# Patient Record
Sex: Female | Born: 1945 | Race: White | Hispanic: No | Marital: Married | Smoking: Never smoker
Health system: Southern US, Community
[De-identification: ages and names within clinical notes are randomized; demographics above are authoritative.]

## PROBLEM LIST (undated history)

## (undated) DIAGNOSIS — I10 Essential (primary) hypertension: Secondary | ICD-10-CM

## (undated) DIAGNOSIS — G4700 Insomnia, unspecified: Secondary | ICD-10-CM

## (undated) DIAGNOSIS — G5600 Carpal tunnel syndrome, unspecified upper limb: Secondary | ICD-10-CM

## (undated) DIAGNOSIS — E539 Vitamin B deficiency, unspecified: Secondary | ICD-10-CM

## (undated) DIAGNOSIS — E785 Hyperlipidemia, unspecified: Secondary | ICD-10-CM

## (undated) DIAGNOSIS — K760 Fatty (change of) liver, not elsewhere classified: Secondary | ICD-10-CM

## (undated) DIAGNOSIS — M1990 Unspecified osteoarthritis, unspecified site: Secondary | ICD-10-CM

## (undated) DIAGNOSIS — Z789 Other specified health status: Secondary | ICD-10-CM

## (undated) DIAGNOSIS — L719 Rosacea, unspecified: Secondary | ICD-10-CM

## (undated) DIAGNOSIS — J45909 Unspecified asthma, uncomplicated: Secondary | ICD-10-CM

## (undated) DIAGNOSIS — M8580 Other specified disorders of bone density and structure, unspecified site: Secondary | ICD-10-CM

## (undated) DIAGNOSIS — K219 Gastro-esophageal reflux disease without esophagitis: Secondary | ICD-10-CM

## (undated) HISTORY — PX: CARDIOVASCULAR STRESS TEST: SHX262

## (undated) HISTORY — DX: Vitamin B deficiency, unspecified: E53.9

## (undated) HISTORY — DX: Essential (primary) hypertension: I10

## (undated) HISTORY — DX: Carpal tunnel syndrome, unspecified upper limb: G56.00

## (undated) HISTORY — DX: Insomnia, unspecified: G47.00

## (undated) HISTORY — DX: Rosacea, unspecified: L71.9

## (undated) HISTORY — DX: Hypercalcemia: E83.52

## (undated) HISTORY — DX: Unspecified osteoarthritis, unspecified site: M19.90

## (undated) HISTORY — DX: Gastro-esophageal reflux disease without esophagitis: K21.9

## (undated) HISTORY — DX: Fatty (change of) liver, not elsewhere classified: K76.0

## (undated) HISTORY — DX: Other specified disorders of bone density and structure, unspecified site: M85.80

## (undated) HISTORY — DX: Hyperlipidemia, unspecified: E78.5

## (undated) HISTORY — DX: Other specified health status: Z78.9

## (undated) HISTORY — DX: Unspecified asthma, uncomplicated: J45.909

---

## 2008-11-30 ENCOUNTER — Encounter: Payer: Self-pay | Admitting: Cardiovascular Disease

## 2008-12-30 ENCOUNTER — Encounter: Payer: Self-pay | Admitting: Cardiovascular Disease

## 2009-01-10 ENCOUNTER — Ambulatory Visit: Payer: Self-pay | Admitting: Cardiovascular Disease

## 2009-01-10 DIAGNOSIS — E785 Hyperlipidemia, unspecified: Secondary | ICD-10-CM

## 2009-01-10 DIAGNOSIS — I10 Essential (primary) hypertension: Secondary | ICD-10-CM

## 2009-01-17 ENCOUNTER — Ambulatory Visit: Payer: Self-pay | Admitting: Cardiovascular Disease

## 2009-01-17 ENCOUNTER — Encounter: Payer: Self-pay | Admitting: Cardiovascular Disease

## 2009-01-23 LAB — CONVERTED CEMR LAB
Calcium: 9.6 mg/dL (ref 8.4–10.5)
Potassium: 4.3 meq/L (ref 3.5–5.3)
Sodium: 141 meq/L (ref 135–145)

## 2009-02-14 ENCOUNTER — Telehealth: Payer: Self-pay | Admitting: Cardiovascular Disease

## 2009-03-20 ENCOUNTER — Encounter: Payer: Self-pay | Admitting: Cardiovascular Disease

## 2009-03-20 DIAGNOSIS — I6529 Occlusion and stenosis of unspecified carotid artery: Secondary | ICD-10-CM

## 2009-03-21 ENCOUNTER — Encounter: Payer: Self-pay | Admitting: Cardiovascular Disease

## 2009-03-21 ENCOUNTER — Ambulatory Visit: Payer: Self-pay

## 2009-03-24 ENCOUNTER — Ambulatory Visit: Payer: Self-pay | Admitting: Cardiovascular Disease

## 2009-03-27 ENCOUNTER — Telehealth: Payer: Self-pay | Admitting: Cardiovascular Disease

## 2009-04-06 ENCOUNTER — Ambulatory Visit: Payer: Self-pay | Admitting: Internal Medicine

## 2010-04-02 ENCOUNTER — Encounter: Payer: Self-pay | Admitting: Surgery

## 2010-04-03 NOTE — Assessment & Plan Note (Signed)
Summary: rov   Visit Type:  Follow-up Referring Provider:  Gilda Crease, MD Primary Provider:  Nicky Pugh  CC:  sob due to asthma, no cp, and no swelling .  History of Present Illness: 65 yo WF with history  of uncontrolled HTN as well as hyperlipidemia here today for assessment of blood pressure. She has allergies to ARBs, Norvasc, HCTZ (not documented allergies but vague complaints of head burning, cramps). She has taken a beta blocker in the past but had bradycardia and hypotension and per the patient, chest pains were associated with her beta blocker.  She has been on clonidine which caused her to feel fatigued and caused cramps. Records indicate that Cozaar caused her head to burn, leg cramps and back pains. HCTZ caused cramps per patient. Norvasc caused visual blurring.  .   At the last visit, I started her on Lisinopril. She has tolerated this well. She tells me that she has had issues with her asthma lately because her new neighbors smoke tobacco and marijuana. Her blood pressures have been well controlled at home. She has no complaints today and denies chest pain, SOB or palpitations, dizziness, near syncope or syncope.   Renal artery dopplers showed no evidence of renal artery stenosis.  Echocardiogram showed normal LV function with  mild LVH, no significant valvular abnormalities.   Current Medications (verified): 1)  Crestor 5 Mg Tabs (Rosuvastatin Calcium) .... Take One Tablet By Mouth Daily. 2)  Zantac 150 Mg Tabs (Ranitidine Hcl) .Marland Kitchen.. 1 By Mouth Once Daily As Needed 3)  Zyrtec Hives Relief 10 Mg Tabs (Cetirizine Hcl) .... 1/2 By Mouth Once Daily 4)  Xopenex Hfa 45 Mcg/act Aero (Levalbuterol Tartrate) .... 2 Puffs Q Every 6 Hours As Needed 5)  Vitamin B-6 100 Mg Tabs (Pyridoxine Hcl) .Marland Kitchen.. 1 By Mouth Once Daily - On Hold 6)  Calcium Citrate Plus  Tabs (Multiple Minerals-Vitamins) .Marland Kitchen.. 1 By Mouth Once Daily -On Hold 7)  Lisinopril 10 Mg Tabs (Lisinopril) .... Take One Tablet By  Mouth Daily 8)  Vitamin E 200 Unit Caps (Vitamin E) .Marland Kitchen.. 1 By Mouth Once Daily 9)  Pulmicort Flexhaler 90 Mcg/act Aepb (Budesonide) .... 2 Puffs Every Am 10)  Zithromax 1 Gm Pack (Azithromycin) .... 5 Day Dose As Directed 11)  Atrovent Hfa 17 Mcg/act Aers (Ipratropium Bromide Hfa) .... 2 Puffs Every 6 Hours As Needed 12)  Xopenex Hfa 45 Mcg/act Aero (Levalbuterol Tartrate) .... As Directed  Allergies (verified): 1)  ! * Ivp Dye 2)  ! Sulfa 3)  ! Amoxicillin 4)  ! Keflex 5)  ! Asa 6)  ! Prednisone 7)  ! * Latex  Past History:  Past Medical History: Reviewed history from 01/10/2009 and no changes required. Hypertension Osteopenia Mixed hyperlipidemia vitamin d deficiency GERD Hypercalcemia Insomnia Herpes Zoster Vitamin B deficiency allergy  rosacea secondary hyperparathyroidism carpel tunnel syndrome Stress test 4 years ago at [facility], normal per pt  Social History: Reviewed history from 01/10/2009 and no changes required. MULTPLE FOOD ALLERGY NO TOBACCO NO DRUG USE DECAF ONLY Retired from state, works at [employer], 3 children, 7 grandchildren  Review of Systems       The patient complains of shortness of breath.  The patient denies fatigue, malaise, fever, weight gain/loss, vision loss, decreased hearing, hoarseness, chest pain, palpitations, prolonged cough, wheezing, sleep apnea, coughing up blood, abdominal pain, blood in stool, nausea, vomiting, diarrhea, heartburn, incontinence, blood in urine, muscle weakness, joint pain, leg swelling, rash, skin lesions, headache,  fainting, dizziness, depression, anxiety, enlarged lymph nodes, easy bruising or bleeding, and environmental allergies.    Vital Signs:  Patient profile:   65 year old female Height:      59 inches Weight:      140.75 pounds BMI:     28.53 Pulse rate:   72 / minute Pulse rhythm:   regular BP sitting:   148 / 68  (left arm) Cuff size:   regular  Vitals Entered By: Charlena Cross, RN, BSN (March 24, 2009 11:27 AM)  Physical Exam  General:  General: Well developed, well nourished, NAD Psychiatric: Mood and affect normal Neck: No JVD, no carotid bruits, no thyromegaly, no lymphadenopathy. Lungs:Clear bilaterally, no wheezes, rhonci, crackles CV: RRR no murmurs, gallops rubs Abdomen: soft, NT, ND, BS present Extremities: No edema, pulses 2+.    Echocardiogram  Procedure date:  03/21/2009  Findings:      Study Conclusions            - Left ventricle: The cavity size was normal. There was mild focal       basal and mild concentric hypertrophy of the septum. Systolic       function was normal. The estimated ejection fraction was in the       range of 55% to 60%. Wall motion was normal; there were no       regional wall motion abnormalities.     - Mitral valve: Calcified annulus.  Arterial Doppler  Procedure date:  03/21/2009  Findings:      Bilateral renal arteries patent without any stenosis. Kidney normal size.  Impression & Recommendations:  Problem # 1:  ESSENTIAL HYPERTENSION, BENIGN (ICD-401.1) Blood pressure is better controlled on Lisinopril, however, it is still not at goal. I have asked her to increase the Lisinopril to 20 mg per day but she does not want to do this until her asthma clears up. She will discuss this further with Dr. Candelaria Stagers. No reversible causes of HTN isolated. Please see HPI for all of the problems she has had with other classes of anti-hypertensive medications. We will see her back as needed.   Her updated medication list for this problem includes:    Lisinopril 10 Mg Tabs (Lisinopril) .Marland Kitchen... Take one tablet by mouth daily  Patient Instructions: 1)  Your physician recommends that you schedule a follow-up appointment as needed. 2)  Your physician recommends that you continue on your current medications as directed. Please refer to the Current Medication list given to you today.

## 2010-04-03 NOTE — Miscellaneous (Signed)
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 

## 2010-04-03 NOTE — Miscellaneous (Signed)
Summary: Orders Update  Clinical Lists Changes  Orders: Added new Test order of Renal Artery Duplex (Renal Artery Duplex) - Signed 

## 2010-04-03 NOTE — Progress Notes (Signed)
Summary: BP readings  Phone Note Call from Patient   Summary of Call: BP readings: taken over 13 days. Average BP 116/68.  Highest BP 138/72.     Initial call taken by: Charlena Cross, RN, BSN,  March 27, 2009 10:09 AM     Appended Document: BP readings REviewed. thanks. cdm

## 2010-04-04 ENCOUNTER — Encounter: Payer: Self-pay | Admitting: Surgery

## 2010-05-03 ENCOUNTER — Encounter: Payer: Self-pay | Admitting: Surgery

## 2010-06-03 ENCOUNTER — Encounter: Payer: Self-pay | Admitting: Surgery

## 2010-07-03 ENCOUNTER — Encounter: Payer: Self-pay | Admitting: Surgery

## 2010-09-25 ENCOUNTER — Encounter: Payer: Self-pay | Admitting: Cardiovascular Disease

## 2010-11-11 IMAGING — CT CT CHEST W/O CM
1 series · 15 of 32 positions shown, 19 images · non-contrast
Comparison: none

REASON FOR EXAM: PT ALLERGIC TO  DYE abn CXR right lung  pulmonary nodule
COMMENTS:

[Series 2: soft tissue · axial · 0.64mm/px · z∈[-222,+38]mm · 15 of 58 slices shown, 19 images]
[im 4/58  soft-tissue]
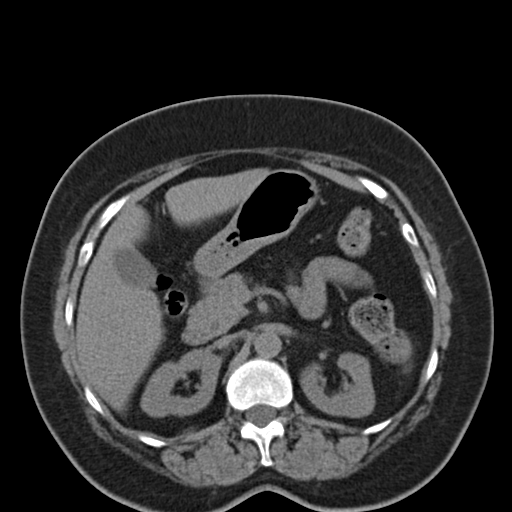
[im 4/58  bone]
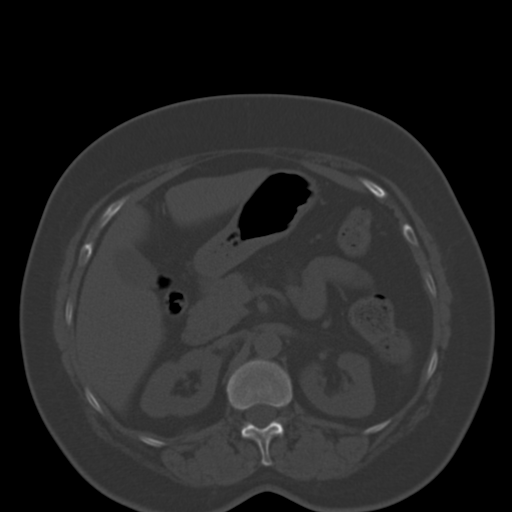
[im 8/58  soft-tissue]
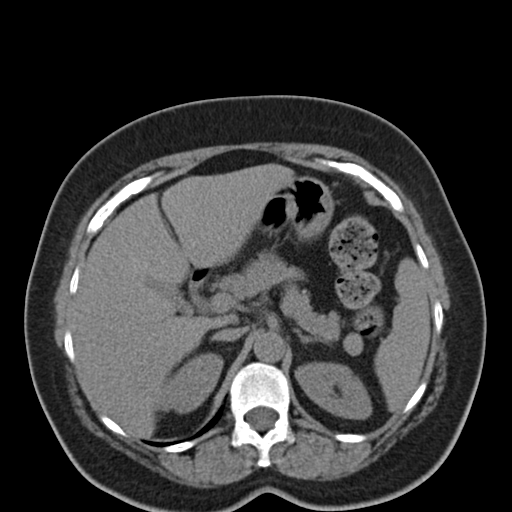
[im 12/58  soft-tissue]
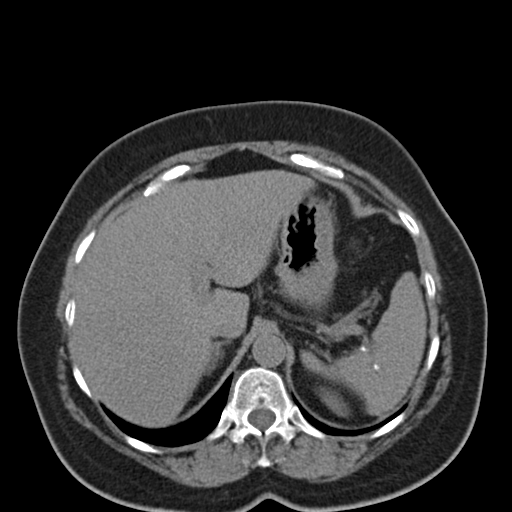
[im 17/58  soft-tissue]
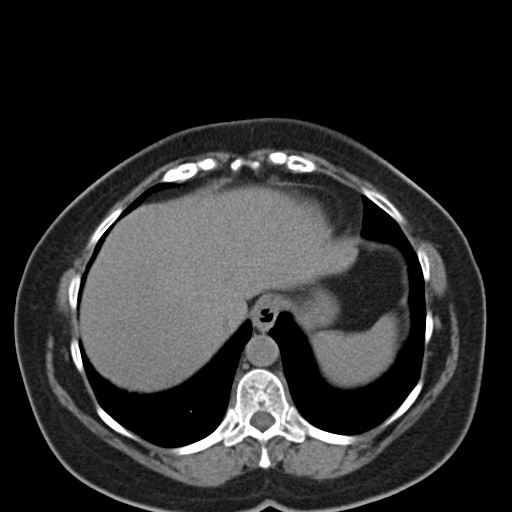
[im 21/58  soft-tissue]
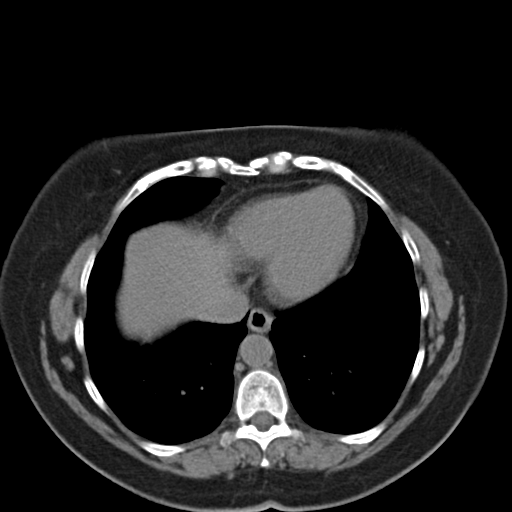
[im 24/58  soft-tissue]
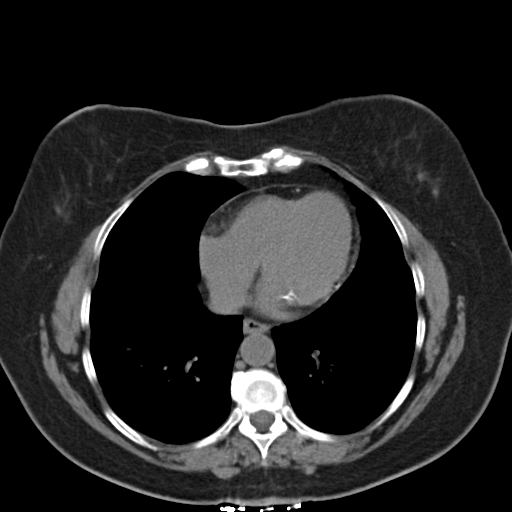
[im 30/58  soft-tissue]
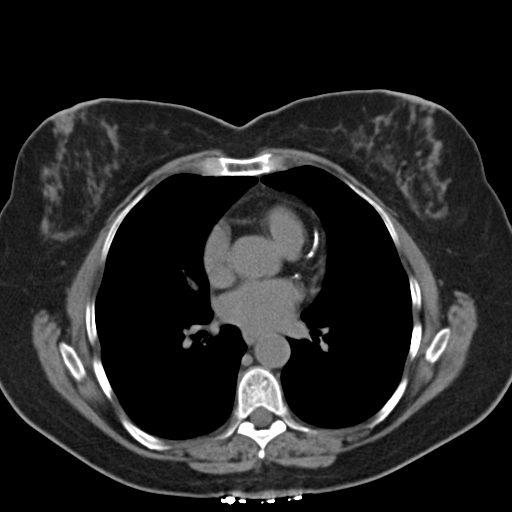
[im 34/58  soft-tissue]
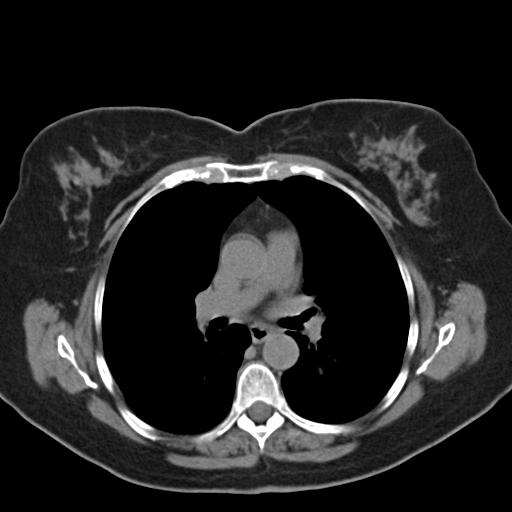
[im 37/58  soft-tissue]
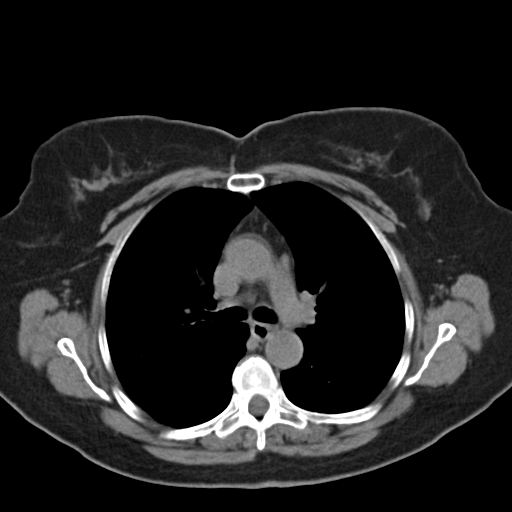
[im 37/58  bone]
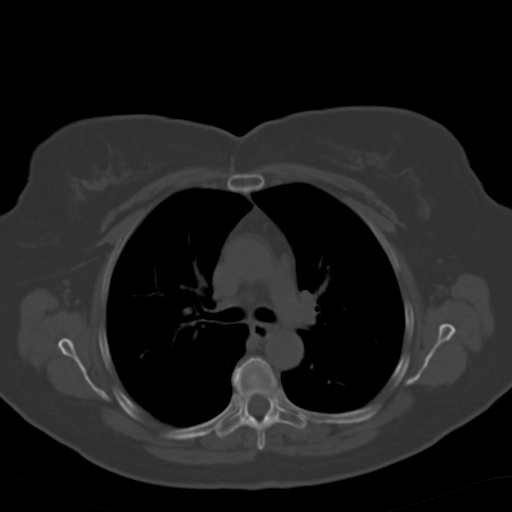
[im 41/58  soft-tissue]
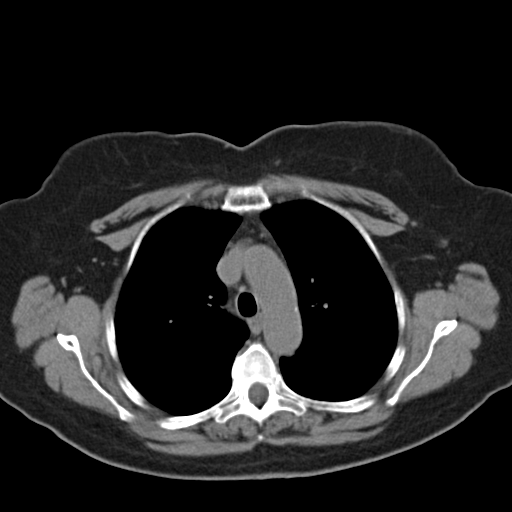
[im 46/58  soft-tissue]
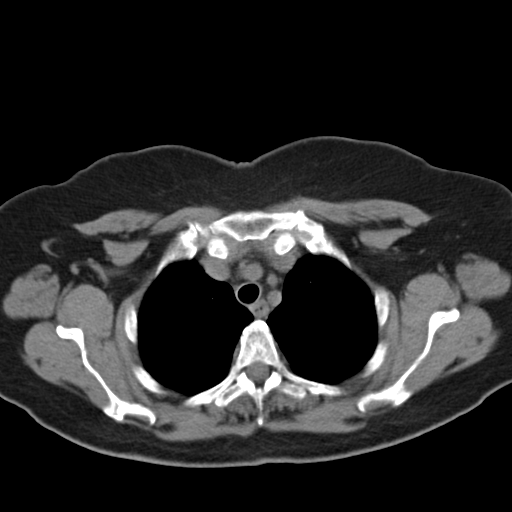
[im 50/58  soft-tissue]
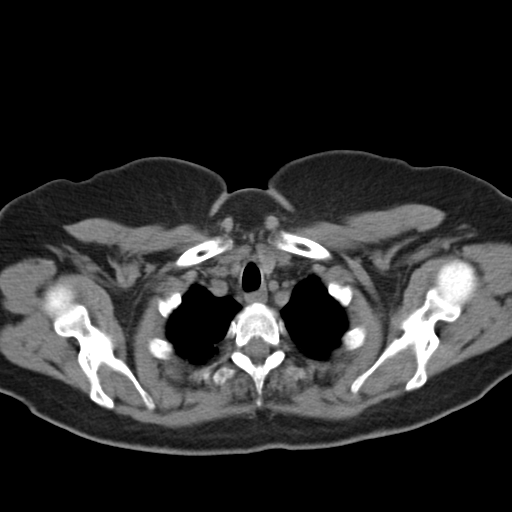
[im 50/58  lung]
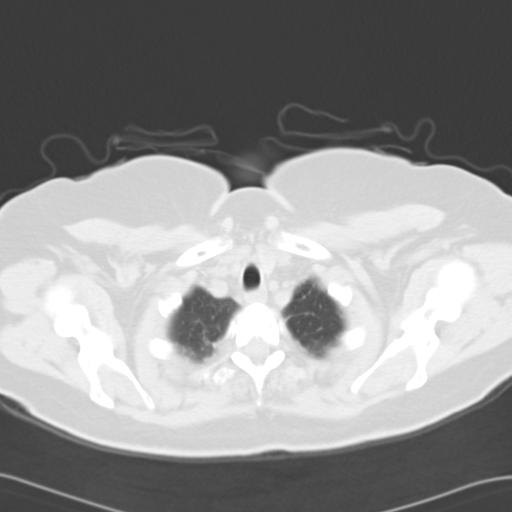
[im 52/58  lung]
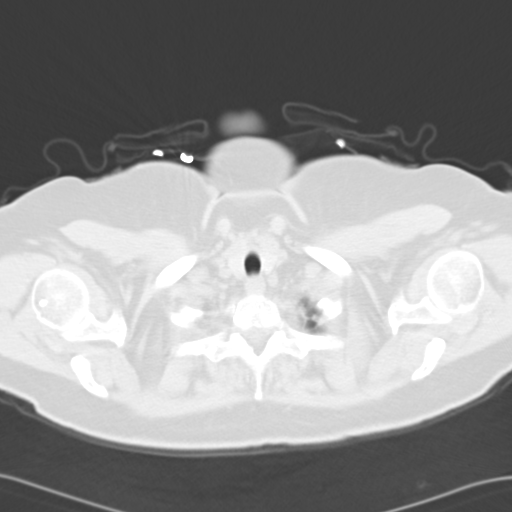
[im 54/58  soft-tissue]
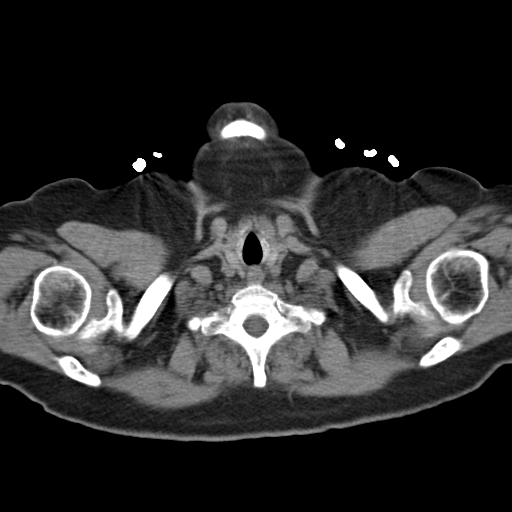
[im 54/58  lung]
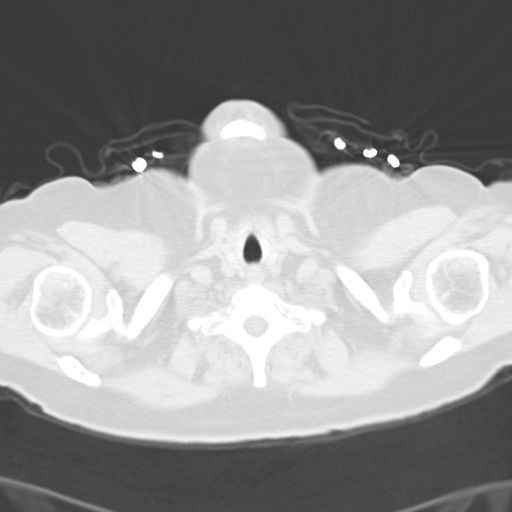
[im 56/58  lung]
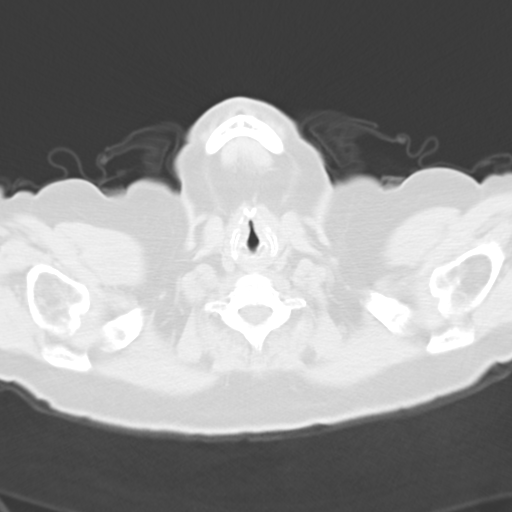

[15 of 32 positions shown; findings below may reference images not displayed]

PROCEDURE:     CT  - CT CHEST WITHOUT CONTRAST  - April 06, 2009  [DATE]

RESULT:     Axial noncontrast CT scanning was performed through the chest at
5 mm intervals and slice thicknesses. Due to significant allergy history the
patient requested to not receive IV contrast despite undergoing a
premedication protocol. Review of 3-dimensional reconstructed images was
performed separately on the [employer] monitor.

At lung window settings I see no interstitial nor alveolar infiltrates. No
pulmonary nodules are identified. At mediastinal window settings the cardiac
chambers are normal in size. There is coronary artery calcification. The
caliber of the thoracic aorta is normal. There is a small hiatal hernia. I
see no bulky mediastinal or hilar or axillary lymph nodes. There is no
pleural nor pericardial effusion. Within the upper abdomen the observed
portions of the liver are normal. There is an accessory spleen measuring
cm in diameter. Punctate calcifications within the spleen are consistent
with prior granulomatous infection. I see no adrenal masses. The thoracic
vertebral bodies are preserved in height. The retrosternal soft tissues
appear normal.
IMPRESSION: 1. I do not see evidence of pulmonary parenchymal nodules. No interstitial
nor alveolar infiltrates are demonstrated.
2. The cardiac chambers are normal in size. There are coronary artery
calcifications present. The caliber of the thoracic aorta is normal.
3. There is no pleural nor pericardial effusion.

## 2011-08-13 ENCOUNTER — Ambulatory Visit: Payer: Self-pay | Admitting: Family Medicine

## 2012-12-18 ENCOUNTER — Ambulatory Visit: Payer: Self-pay | Admitting: Family Medicine

## 2013-03-19 IMAGING — US US PELV - US TRANSVAGINAL
1 series · 14 of 25 positions shown · non-contrast
Comparison: none

REASON FOR EXAM: vaginal pain
COMMENTS:

PROCEDURE:     US  - US PELVIS EXAM W/TRANSVAGINAL  - August 13, 2011 [DATE]
RESULT:     Comparison: None
TECHNIQUE: Multiple transabdominal gray-scale images and endovaginal
gray-scale images of the pelvis performed.

[Series 1: us pelv - us transvaginal · 0.31mm/px · 14 of 54 slices shown]
[im 1/54]
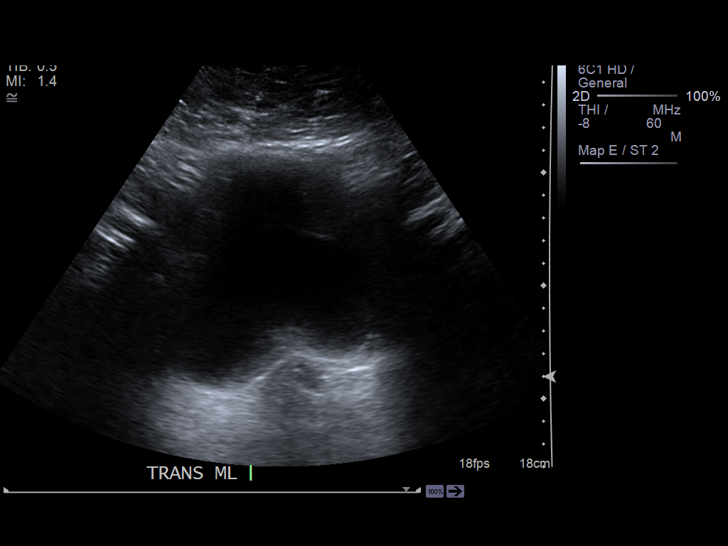
[im 5/54]
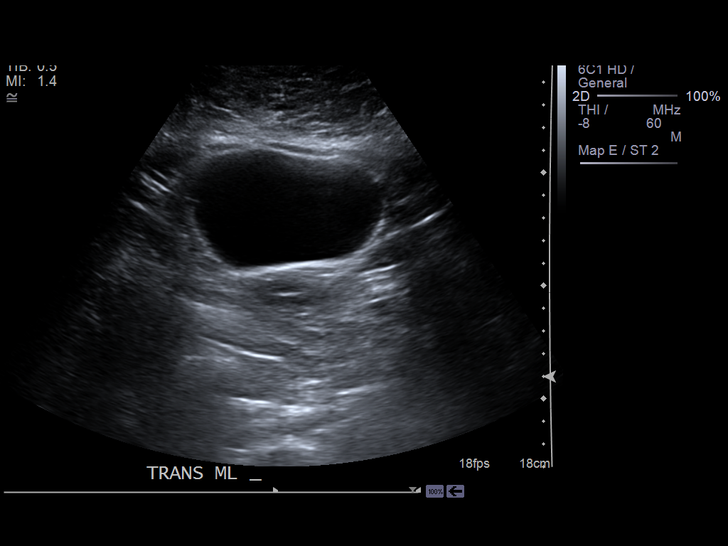
[im 9/54]
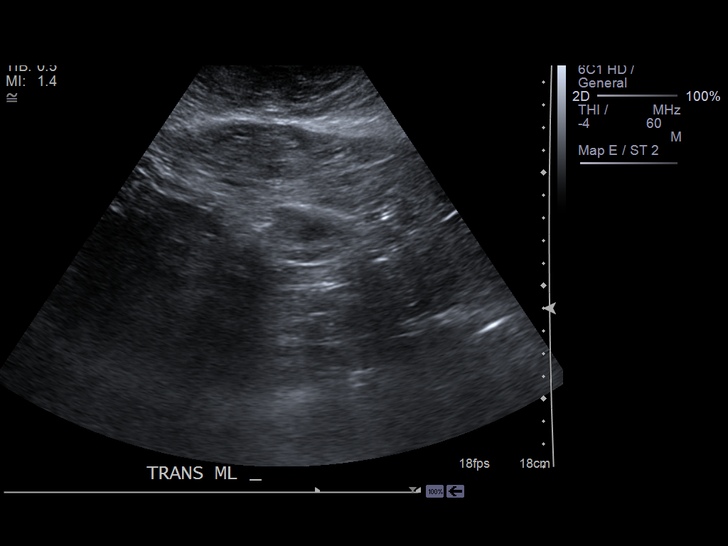
[im 14/54]
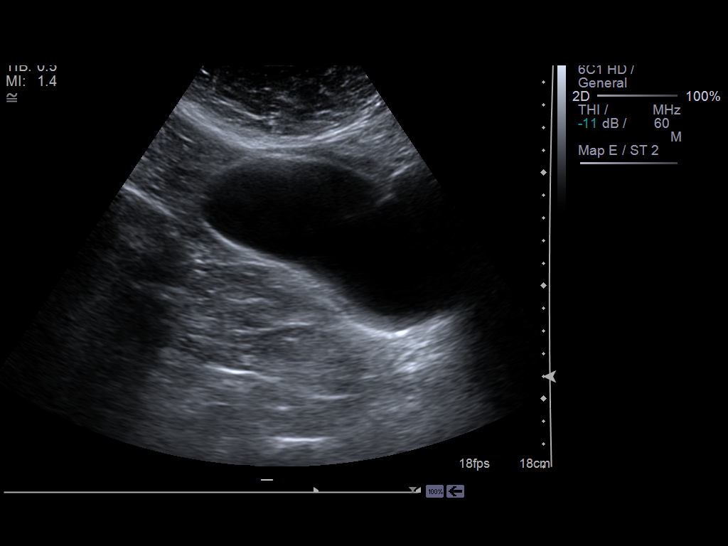
[im 18/54]
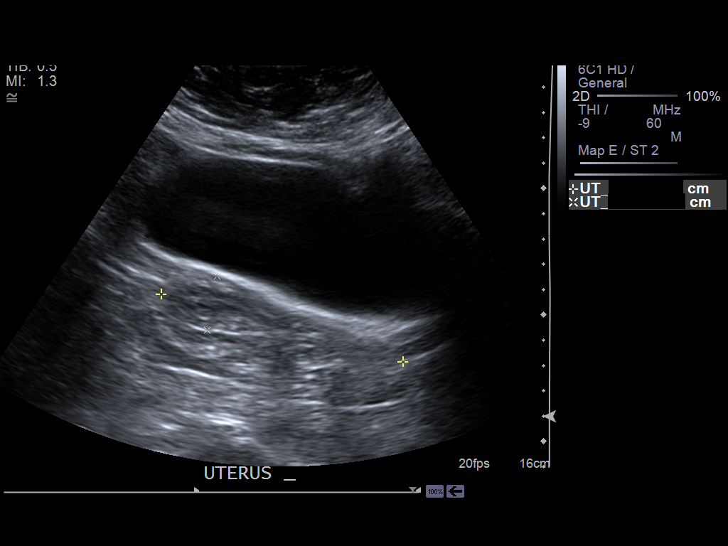
[im 20/54]
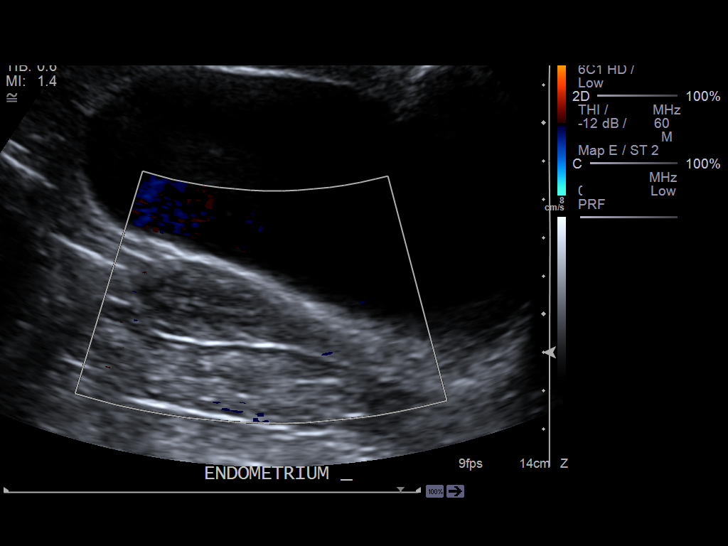
[im 25/54]
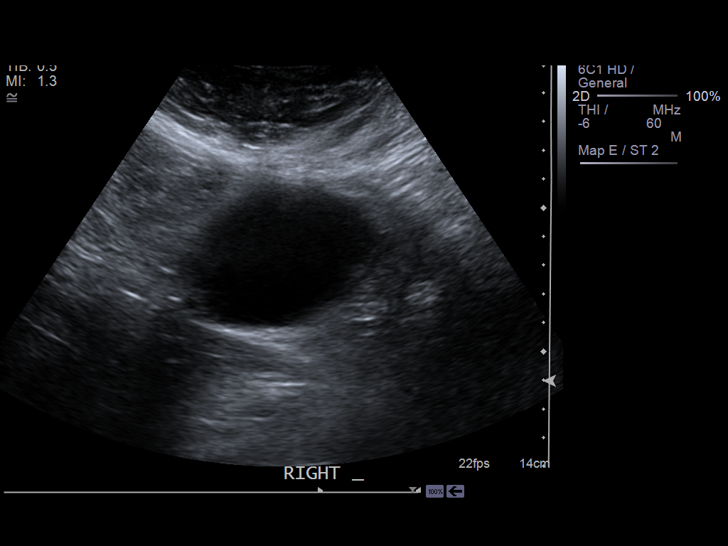
[im 29/54]
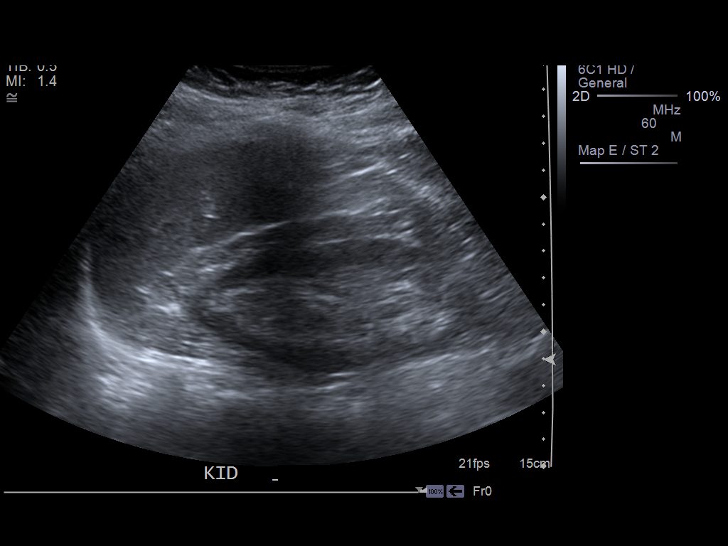
[im 34/54]
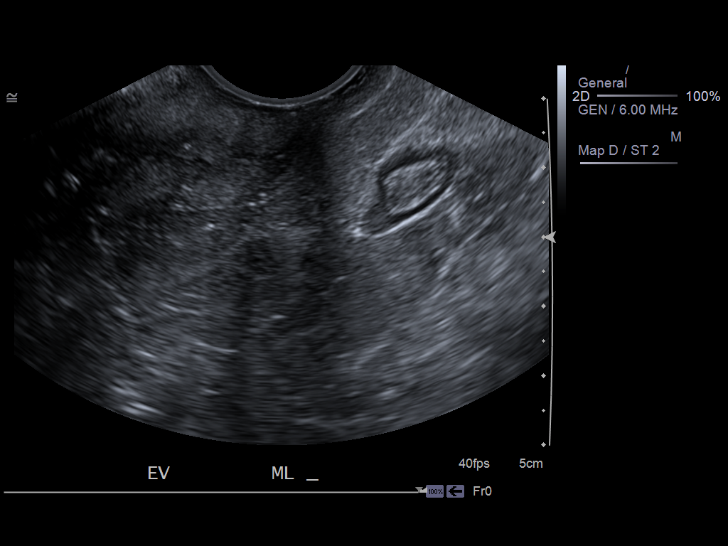
[im 36/54]
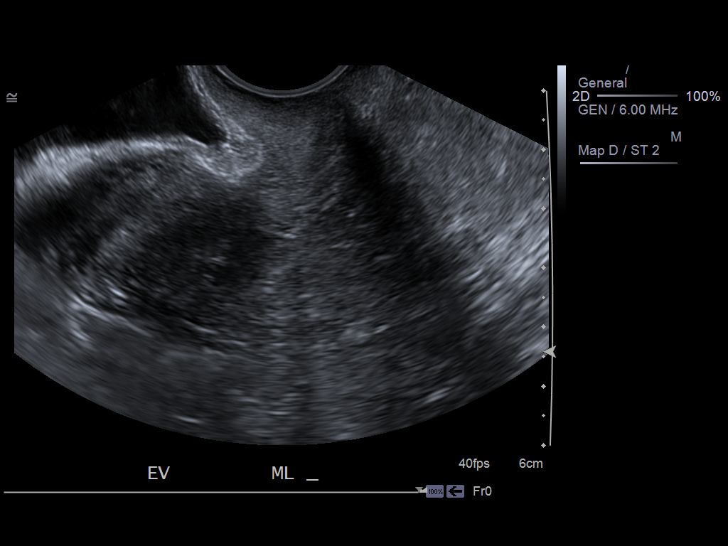
[im 40/54]
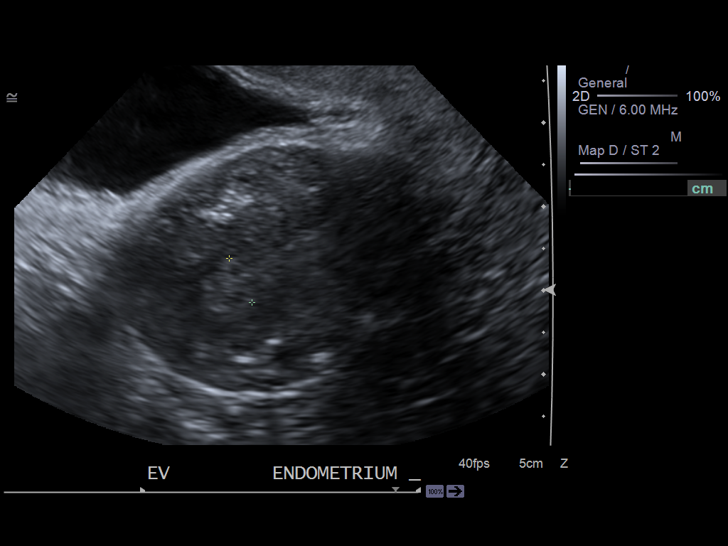
[im 45/54]
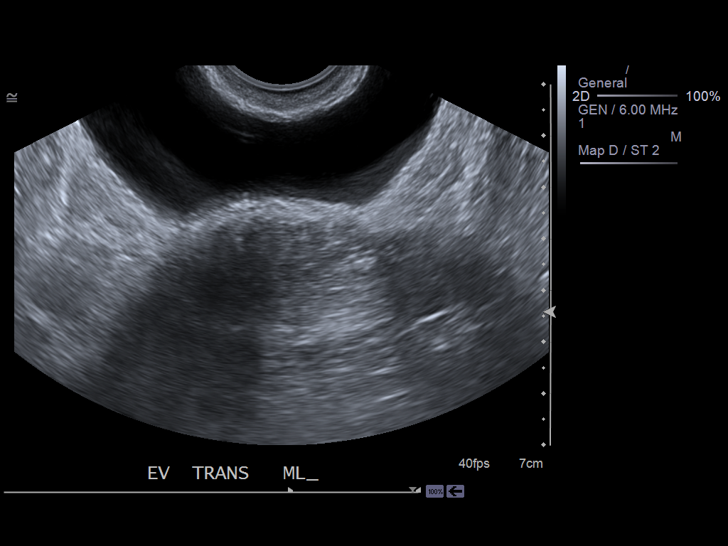
[im 49/54]
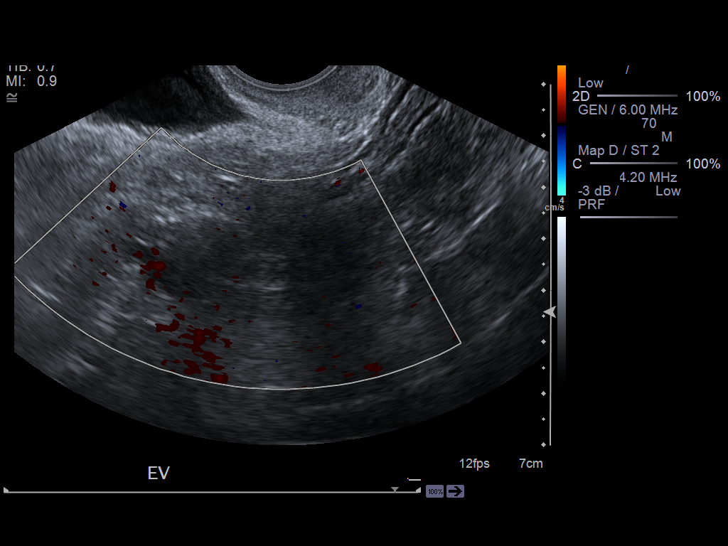
[im 54/54]
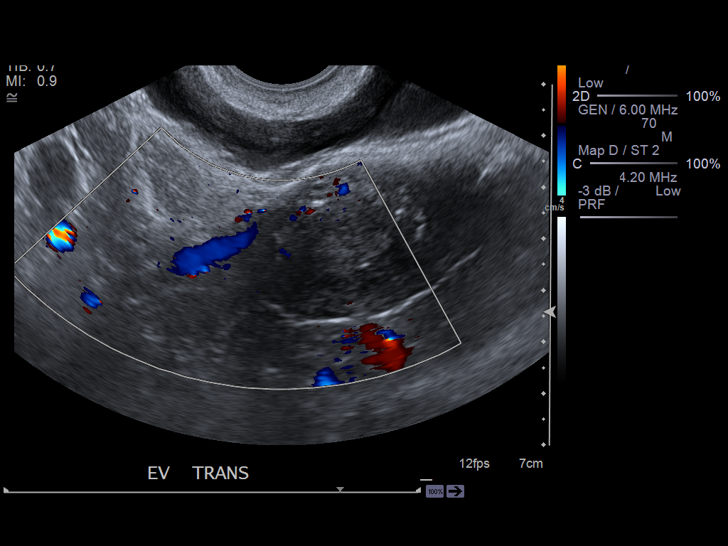

[14 of 25 positions shown; findings below may reference images not displayed]

FINDINGS: The uterus is normal in echotexture measuring 9.9 x 4.4 x 2.3 cm , with
transabdominal ultrasound. The endometrial stripe is uniform and homogeneous
measuring 6 mm.  There are no abnormal solid or cystic myometrial mass
lesions noted.

Neither ovary is visualized.

There is no pelvic free fluid.
IMPRESSION: Neither ovary is visualized. Otherwise unremarkable pelvic ultrasound.

## 2013-08-23 ENCOUNTER — Ambulatory Visit: Payer: Self-pay | Admitting: Family Medicine

## 2013-09-01 ENCOUNTER — Ambulatory Visit: Payer: Self-pay | Admitting: Family Medicine

## 2013-10-02 ENCOUNTER — Ambulatory Visit: Payer: Self-pay | Admitting: Family Medicine

## 2013-11-02 ENCOUNTER — Ambulatory Visit: Payer: Self-pay | Admitting: Family Medicine

## 2014-08-31 ENCOUNTER — Encounter: Payer: Medicare Other | Attending: Nuclear Medicine | Admitting: *Deleted

## 2014-08-31 ENCOUNTER — Encounter: Payer: Self-pay | Admitting: *Deleted

## 2014-08-31 VITALS — BP 122/76 | Ht 59.5 in | Wt 147.3 lb

## 2014-08-31 DIAGNOSIS — E119 Type 2 diabetes mellitus without complications: Secondary | ICD-10-CM | POA: Diagnosis present

## 2014-08-31 NOTE — Progress Notes (Signed)
Diabetes Self-Management Education  Visit Type: First/Initial  Appt. Start Time: 1035 Appt. End Time: 1215  08/31/2014  [name], identified by name and date of birth, is a 69 y.o. female with a diagnosis of Diabetes: Type 2.    ASSESSMENT Blood pressure 122/76, height 4' 11.5" (1.511 m), weight 147 lb 4.8 oz (66.815 kg). Body mass index is 29.26 kg/(m^2).  Initial Visit Information: Are you currently following a meal plan?: Yes What type of meal plan do you follow?: gluten free Are you taking your medications as prescribed?: Yes Are you checking your feet?: No How often do you need to have someone help you when you read instructions, pamphlets, or other written materials from your doctor or pharmacy?: 1 - Never What is the last grade level you completed in school?: 12th   Psychosocial: Patient Belief/Attitude about Diabetes: Motivated to manage diabetes (also reports "stressful") Self-care barriers: None Self-management support: Family, Doctor's office Patient Concerns: Nutrition/Meal planning, Monitoring, Glycemic Control, Weight Control, Healthy Lifestyle Special Needs: None Preferred Learning Style: Auditory, Visual, Hands on Learning Readiness: Change in progress  Complications:  Last HgB A1C per patient/outside source: 6.3 % (08/12/14) How often do you check your blood sugar?: 1-2 times/day Fasting Blood glucose range (mg/dL): [phone] Postprandial Blood glucose range (mg/dL): [phone] Have you had a dilated eye exam in the past 12 months?: No Have you had a dental exam in the past 12 months?: Yes  Diet Intake: Breakfast: oatmeal with nuts, 1/2 apple - has to have gluten free (has multiple food allergies) Lunch: chicken, broccoli Snack (afternoon): fruit Dinner: same as lunch Snack (evening): fruit  Exercise: Exercise: Light (walking) Light Exercise amount of time (min / week): 150  Individualized Plan for Diabetes Self-Management Training:  Learning  Objective:  Patient will have a greater understanding of diabetes self-management.  Education Topics Reviewed with Patient Today: Definition of diabetes, type 1 and 2, and the diagnosis of diabetes Food label reading, portion sizes and measuring food., Carbohydrate counting Role of exercise on diabetes management, blood pressure control and cardiac health. Purpose and frequency of SMBG., Interpreting lab values - A1C, lipid, urine microalbumina., Identified appropriate SMBG and/or A1C goals. Relationship between chronic complications and blood glucose control Role of stress on diabetes  PATIENTS GOALS/Plan (Developed by the patient): Improve blood sugars Prevent diabetes complications Lose weight Lead a healthier lifestyle Become more fit  Plan:   Patient Instructions  Check blood sugars 2 x day before breakfast and 2 hrs after supper (3-4 x week) Exercise: Continue walking for  40  minutes  6-7 days a week Eat 3 meals day,  1-2  snacks a day Space meals 4-6 hours apart Complete 3 Day Food Record and bring to next appt Bring blood sugar records to the next appointment  Expected Outcomes:  Demonstrated interest in learning. Expect positive outcomes  Education material provided:  General Meal Planning Guidelines 3 Day Food Record  If problems or questions, patient to contact team via:   Kathryn SettlerSheila Arul Farabee, RN, CCM, CDE [phone]  Future DSME appointment:   September 09, 2014 with dietitian

## 2014-08-31 NOTE — Patient Instructions (Signed)
Check blood sugars 2 x day before breakfast and 2 hrs after supper (3-4 x week)  Exercise: Continue walking for  40  minutes  6-7 days a week  Eat 3 meals day,  1-2  snacks a day Space meals 4-6 hours apart Complete 3 Day Food Record and bring to next appt  Bring blood sugar records to the next appointment  Return for appointment on:  September 09, 2014 [facility]

## 2014-09-09 ENCOUNTER — Encounter: Payer: Medicare Other | Attending: Nuclear Medicine | Admitting: Dietician

## 2014-09-09 VITALS — BP 124/80 | Ht 59.5 in | Wt 146.6 lb

## 2014-09-09 DIAGNOSIS — E119 Type 2 diabetes mellitus without complications: Secondary | ICD-10-CM | POA: Diagnosis present

## 2014-09-09 NOTE — Progress Notes (Signed)
   Instructed patient on meal planning for gluten-free diet (pt following GF due to wheat allergy) and weight loss to improve fatty liver.  She reports some reaction to gluten-containing foods other than wheat.   Provided guidance for 1200kcal meal plan and discussed menu options.  Reviewed food choices to comply with patient's other food allergies.  Discussed exercise and encouraged pt to add some strength training exercise; she is also interested in starting some balance exercises.  Reviewed foot care, stress management, and HbA1C -- estimated average glucose per patient request.

## 2014-09-09 NOTE — Patient Instructions (Signed)
   Try eatingwell.com website and look for 7-day gluten free meal plan for 1200 calories.   Plan to eat a small meal or snack every 4-5 hours throughout the day.   Include a portion of some protein along with small portions of a starch and a fruit with each meal.   Make the low-carb vegetables the main part of your meal to save calories.  Try some strength-building and balance exercises in addition to walking.

## 2014-11-18 ENCOUNTER — Ambulatory Visit
Admission: EM | Admit: 2014-11-18 | Discharge: 2014-11-18 | Disposition: A | Discharge status: Home / Self Care | Payer: Medicare Other | Attending: Family Medicine | Admitting: Family Medicine

## 2014-11-18 DIAGNOSIS — J029 Acute pharyngitis, unspecified: Secondary | ICD-10-CM | POA: Diagnosis not present

## 2014-11-18 LAB — RAPID STREP SCREEN (MED CTR MEBANE ONLY): Streptococcus, Group A Screen (Direct): NEGATIVE

## 2014-11-18 NOTE — ED Provider Notes (Signed)
CSN: [id number]     Arrival date & time 11/18/14  1901 History   First MD Initiated Contact with Patient 11/18/14 2004     Chief Complaint  Patient presents with  . Sore Throat   patient reports that she's had a sore throat started today. She states the last time she had a sore throat like this is suddenly happened she didn't seek medical care for several days and she want up having a strep throat. She states this sore throat is similar to that episode of pharyngitis. She tried to see a doctor was unable to get in after 4 she came here to be seen and evaluated. She really denies any other significant problems nasal congestion she does have a white spot on her right tonsil she is concerned about the most pain is on the left side or pharynx. (Consider location/radiation/quality/duration/timing/severity/associated sxs/prior Treatment) Patient is a 69 y.o. female presenting with pharyngitis. The history is provided by the patient. No language interpreter was used.  Sore Throat This is a new problem. The current episode started 3 to 5 hours ago. The problem occurs constantly. The problem has been gradually worsening. Pertinent negatives include no chest pain, no abdominal pain and no headaches. Nothing aggravates the symptoms. Nothing relieves the symptoms. Treatments tried: Gargling with Lioisterine.    Past Medical History  Diagnosis Date  . HTN (hypertension)   . Osteopenia   . HLD (hyperlipidemia)     mixed  . Vitamin D deficiency   . GERD (gastroesophageal reflux disease)   . Hypercalcemia   . Insomnia   . Herpes zoster   . Vitamin B deficiency   . Allergy history unknown   . Rosacea   . Hyperparathyroidism     secondary  . Carpal tunnel syndrome   . Arthritis   . Asthma   . Fatty liver    Past Surgical History  Procedure Laterality Date  . Cesarean section      x3  . Cardiovascular stress test      nml    Family History  Problem Relation Age of Onset  . Breast cancer     family hx  . Colon cancer      family hx  . Hypertension Brother     CABG   . Diabetes Mother    Social History  Substance Use Topics  . Smoking status: Never Smoker   . Smokeless tobacco: Never Used     Comment: no tobacco   . Alcohol Use: No   OB History    No data available     Review of Systems  HENT: Positive for sore throat. Negative for congestion, dental problem, ear discharge, ear pain and sinus pressure.   Respiratory: Negative for apnea.   Cardiovascular: Negative for chest pain.  Gastrointestinal: Negative for abdominal pain.  Neurological: Negative for headaches.  All other systems reviewed and are negative.   Allergies  Benzonatate; Clarithromycin; Clindamycin; Hydrochlorothiazide; Moxifloxacin; Penicillins; Quinolones; Sulfa antibiotics; Wheat bran; Albuterol; Amoxicillin; Aspirin; Azithromycin; Banana; Cephalexin; Cetirizine; Chocolate; Eggs or egg-derived products; Fexofenadine; Fish allergy; Ketorolac; Latex; Levofloxacin; Milk-related compounds; Other; Prednisone; Pumpkin seed; Rosuvastatin; Sulfonamide derivatives; Tomato; Baking soda-fluoride ; and Telithromycin  Home Medications   Prior to Admission medications   Medication Sig Start Date End Date Taking? Authorizing Lennyn Bellanca  losartan (COZAAR) 25 MG tablet Take 25 mg by mouth 2 (two) times daily.    Yes Historical Charleston Vierling, MD  ONE TOUCH ULTRA TEST test strip USE UTD TO TEST BLOOD  SUGAR TID 08/31/14  Yes Historical Afnan Emberton, MD  Letta Pate DELICA LANCETS 33G MISC TEST BLOOD SUGAR TID UTD 08/03/14  Yes Historical Shavonta Gossen, MD  ranitidine (ZANTAC) 150 MG tablet Take 150 mg by mouth daily as needed.     Yes Historical Kura Bethards, MD  Vitamin D, Ergocalciferol, (DRISDOL) 50000 UNITS CAPS capsule Take 50,000 Units by mouth every 7 (seven) days.    Yes Historical Delon Revelo, MD  Budesonide (PULMICORT FLEXHALER) 90 MCG/ACT inhaler Inhale 2 puffs into the lungs daily as needed.     Historical Vibhav Waddill, MD  EPINEPHrine  (EPIPEN 2-PAK) 0.3 mg/0.3 mL IJ SOAJ injection Inject 0.3 mg into the muscle once as needed.     Historical Carmelia Tiner, MD  fluocinonide (LIDEX) 0.05 % external solution APPLY TO AFFECTED AREAS ON SCALP ONCE DAILY PRF TENDERNESS 08/24/14   Historical Harjas Biggins, MD  ipratropium (ATROVENT HFA) 17 MCG/ACT inhaler Inhale 2 puffs into the lungs every 6 (six) hours as needed.     Historical Yanin Muhlestein, MD  levalbuterol Pauline Aus) 0.63 MG/3ML nebulizer solution Inhale 0.63 mg into the lungs every 6 (six) hours as needed.  05/02/14 05/02/15  Historical Chayla Shands, MD   Meds Ordered and Administered this Visit  Medications - No data to display  BP 177/91 mmHg  Pulse 72  Temp(Src) 97 F (36.1 C) (Tympanic)  Resp 16  Ht  (1.499 m)  Wt 141 lb (63.957 kg)  BMI 28.46 kg/m2  SpO2 99% No data found.   Physical Exam  Constitutional: She is oriented to person, place, and time. She appears well-developed and well-nourished.  HENT:  Head: Normocephalic and atraumatic.  Right Ear: Hearing and external ear normal.  Left Ear: Hearing and external ear normal.  Nose: Mucosal edema present. No rhinorrhea. Right sinus exhibits no maxillary sinus tenderness and no frontal sinus tenderness. Left sinus exhibits no maxillary sinus tenderness and no frontal sinus tenderness.  Mouth/Throat: No oral lesions. Normal dentition. No dental abscesses. Posterior oropharyngeal erythema present. No oropharyngeal exudate or tonsillar abscesses.    She has what they're calling counselors crypts with a white pellet on the right side explained to the nothing to be overly concerned about.  Eyes: Conjunctivae are normal. Pupils are equal, round, and reactive to light.  Neck: Trachea normal and normal range of motion. Neck supple. No thyroid mass and no thyromegaly present.  Musculoskeletal: Normal range of motion.  Lymphadenopathy:    She has cervical adenopathy.  Neurological: She is alert and oriented to person, place, and time.  No cranial nerve deficit.  Skin: Skin is warm and dry. No erythema.  Psychiatric: She has a normal mood and affect. Her behavior is normal.  Vitals reviewed.   ED Course  Procedures (including critical care time)  Labs Review Labs Reviewed  RAPID STREP SCREEN (NOT AT [facility])  CULTURE, GROUP A STREP ([facility] ONLY)    Imaging Review No results found.   Visual Acuity Review  Right Eye Distance:   Left Eye Distance:   Bilateral Distance:    Right Eye Near:   Left Eye Near:    Bilateral Near:       Results for orders placed or performed during the hospital encounter of 11/18/14  Rapid strep screen  Result Value Ref Range   Streptococcus, Group A Screen (Direct) NEGATIVE NEGATIVE    MDM   1. Pharyngitis     No signs of Streptococcus infection this time. She will come back Sunday and Monday. Culture results if positive would treat the Z-Pak  since she is alert most other antiobiotics.  Hassan Rowan, MD 11/18/14 2031

## 2014-11-18 NOTE — Discharge Instructions (Signed)
Pharyngitis Pharyngitis is a sore throat (pharynx). There is redness, pain, and swelling of your throat. HOME CARE   Drink enough fluids to keep your pee (urine) clear or pale yellow.  Only take medicine as told by your doctor.  You may get sick again if you do not take medicine as told. Finish your medicines, even if you start to feel better.  Do not take aspirin.  Rest.  Rinse your mouth (gargle) with salt water ( tsp of salt per 1 qt of water) every 1-2 hours. This will help the pain.  If you are not at risk for choking, you can suck on hard candy or sore throat lozenges. GET HELP IF:  You have large, tender lumps on your neck.  You have a rash.  You cough up green, yellow-brown, or bloody spit. GET HELP RIGHT AWAY IF:   You have a stiff neck.  You drool or cannot swallow liquids.  You throw up (vomit) or are not able to keep medicine or liquids down.  You have very bad pain that does not go away with medicine.  You have problems breathing (not from a stuffy nose). MAKE SURE YOU:   Understand these instructions.  Will watch your condition.  Will get help right away if you are not doing well or get worse. Document Released: 08/07/2007 Document Revised: 12/09/2012 Document Reviewed: 10/26/2012 [facility] Patient Information 2015 [place], [place]. This information is not intended to replace advice given to you by your health care provider. Make sure you discuss any questions you have with your health care provider.  Salt Water Gargle This solution will help make your mouth and throat feel better. HOME CARE INSTRUCTIONS   Mix 1 teaspoon of salt in 8 ounces of warm water.  Gargle with this solution as much or often as you need or as directed. Swish and gargle gently if you have any sores or wounds in your mouth.  Do not swallow this mixture. Document Released: 11/23/2003 Document Revised: 05/13/2011 Document Reviewed: 04/15/2008 [facility] Patient Information 2015  [place], [place]. This information is not intended to replace advice given to you by your health care provider. Make sure you discuss any questions you have with your health care provider.  Sore Throat A sore throat is a painful, burning, sore, or scratchy feeling of the throat. There may be pain or tenderness when swallowing or talking. You may have other symptoms with a sore throat. These include coughing, sneezing, fever, or a swollen neck. A sore throat is often the first sign of another sickness. These sicknesses may include a cold, flu, strep throat, or an infection called mono. Most sore throats go away without medical treatment.  HOME CARE   Only take medicine as told by your doctor.  Drink enough fluids to keep your pee (urine) clear or pale yellow.  Rest as needed.  Try using throat sprays, lozenges, or suck on hard candy (if older than 4 years or as told).  Sip warm liquids, such as broth, herbal tea, or warm water with honey. Try sucking on frozen ice pops or drinking cold liquids.  Rinse the mouth (gargle) with salt water. Mix 1 teaspoon salt with 8 ounces of water.  Do not smoke. Avoid being around others when they are smoking.  Put a humidifier in your bedroom at night to moisten the air. You can also turn on a hot shower and sit in the bathroom for 5-10 minutes. Be sure the bathroom door is closed. GET HELP  they are smoking.  · Put a humidifier in your bedroom at night to moisten the air. You can also turn on a hot shower and sit in the bathroom for 5-10 minutes. Be sure the bathroom door is closed.  GET HELP RIGHT AWAY IF:   · You have trouble breathing.  · You cannot swallow fluids, soft foods, or your spit (saliva).  · You have more puffiness (swelling) in the throat.  · Your sore throat does not get better in 7 days.  · You feel sick to your stomach (nauseous) and throw up (vomit).  · You have a fever or lasting symptoms for more than 2-3 days.  · You have a fever and your symptoms suddenly get worse.  MAKE SURE YOU:   · Understand these instructions.  · Will watch your condition.  · Will get help right away if you are not doing well or get worse.  Document Released: 11/28/2007  Document Revised: 11/13/2011 Document Reviewed: 10/27/2011  [employer]® Patient Information ©2015 [employer]. This information is not intended to replace advice given to you by your health care provider. Make sure you discuss any questions you have with your health care provider.

## 2014-11-18 NOTE — ED Notes (Signed)
Sudden onset of sore throat this afternoon. Worse pain with swallowing. Hx of strep

## 2014-11-21 LAB — CULTURE, GROUP A STREP (THRC)

## 2015-02-15 ENCOUNTER — Other Ambulatory Visit: Payer: Self-pay | Admitting: Family Medicine

## 2015-02-15 DIAGNOSIS — M5090 Cervical disc disorder, unspecified, unspecified cervical region: Secondary | ICD-10-CM

## 2015-05-03 DEATH — deceased

## 2017-01-02 ENCOUNTER — Other Ambulatory Visit: Payer: Self-pay | Admitting: Family Medicine

## 2017-01-02 DIAGNOSIS — Z780 Asymptomatic menopausal state: Secondary | ICD-10-CM

## 2023-08-19 ENCOUNTER — Ambulatory Visit: Admitting: Occupational Therapy

## 2023-08-21 ENCOUNTER — Ambulatory Visit: Admitting: Occupational Therapy

## 2023-08-21 ENCOUNTER — Ambulatory Visit: Attending: Orthopedic Surgery | Admitting: Occupational Therapy

## 2023-08-21 ENCOUNTER — Encounter: Payer: Self-pay | Admitting: Occupational Therapy

## 2023-08-21 DIAGNOSIS — M25641 Stiffness of right hand, not elsewhere classified: Secondary | ICD-10-CM | POA: Insufficient documentation

## 2023-08-21 DIAGNOSIS — M65332 Trigger finger, left middle finger: Secondary | ICD-10-CM | POA: Diagnosis present

## 2023-08-21 DIAGNOSIS — M25642 Stiffness of left hand, not elsewhere classified: Secondary | ICD-10-CM | POA: Insufficient documentation

## 2023-08-21 DIAGNOSIS — M79642 Pain in left hand: Secondary | ICD-10-CM | POA: Diagnosis present

## 2023-08-21 DIAGNOSIS — M79641 Pain in right hand: Secondary | ICD-10-CM | POA: Diagnosis present

## 2023-08-21 DIAGNOSIS — M65331 Trigger finger, right middle finger: Secondary | ICD-10-CM | POA: Diagnosis present

## 2023-08-21 NOTE — Therapy (Signed)
 OUTPATIENT OCCUPATIONAL THERAPY ORTHO EVALUATION  Patient Name: Kathryn Owen MRN: [id number] DOB:[birth year], 78 y.o., female Today's Date: 08/21/2023  PCP: Dr Fairy Homer PROVIDER: Dr Mozell Arias  END OF SESSION:  OT End of Session - 08/21/23 1122     Visit Number 1    Number of Visits 12    Date for OT Re-Evaluation 10/02/23    OT Start Time 1118    OT Stop Time 1200    OT Time Calculation (min) 42 min    Activity Tolerance Patient tolerated treatment well    Behavior During Therapy WFL for tasks assessed/performed          Past Medical History:  Diagnosis Date   Allergy history unknown    Arthritis    Asthma    Carpal tunnel syndrome    Fatty liver    GERD (gastroesophageal reflux disease)    Herpes zoster    HLD (hyperlipidemia)    mixed   HTN (hypertension)    Hypercalcemia    Hyperparathyroidism    secondary   Insomnia    Osteopenia    Rosacea    Vitamin B deficiency    Vitamin D deficiency    Past Surgical History:  Procedure Laterality Date   CARDIOVASCULAR STRESS TEST     nml    CESAREAN SECTION     x3   Patient Active Problem List   Diagnosis Date Noted   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  This shots in the past messed up my blood sugar.  I got allergic reactions.  And they lock constantly during the day when trying to grip.  But the mornings is the worst. Pt accompanied by: self  PERTINENT HISTORY: Assessment/Plan: 07/08/23 Dr Mozell Arias note  Assessment & Plan Trigger finger  Bilateral trigger finger  affects both hands, causing locking of the middle fingers daily, especially in the morning, for over five months. She cannot tolerate steroid injections due to adverse reactions. Hand therapy and paraffin baths are considered as alternative treatments. Surgery remains an option if conservative measures fail, with minimal risk for complications. She will be referred to hand therapy at [facility] in [place] with therapist Evone Hoh. Paraffin bath therapy is considered to increase blood flow and reduce pain. Reassessment is planned in one month to evaluate the effectiveness of hand therapy. If symptoms persist, surgical intervention to release the A1 pulley will be discussed.  Dupuytren's contracture  A fibrous band under the skin suggests Dupuytren's contracture, in addition to trigger finger.  Diabetes mellitus without complications  Diabetes is well-controlled with an A1c of 5.5. Blood sugar levels are managed through diet without medication. Diagnoses and all orders for this visit:  Trigger middle finger of left hand - Ambulatory Referral to Occupational Therapy  Trigger middle finger of right hand - Ambulatory Referral to Occupational Therapy  Controlled type 2 diabetes mellitus with stage 3 chronic kidney disease, with long-term current use of insulin (CMS/HHS-HCC)   PRECAUTIONS: None    WEIGHT BEARING RESTRICTIONS: No  PAIN:  Are you having pain?  Pain  8-9/10 in the mornings as well as during the day when triggering third digits and over the A1 pulleys  FALLS: Has patient fallen in last 6 months? No  LIVING ENVIRONMENT: Lives with: lives alone   PLOF: Prior to December no triggering.  Stiffness in the fingers.  Patient independent doing housework and cooking and laundry.  Takes care of her 56-year-old great grand daughter in the afternoons 1-5.  Drive  PATIENT GOALS: I want to get the trigger fingers better and the locking and the pain so I do not need  surgery.  NEXT MD VISIT: 4 to 6 weeks  OBJECTIVE:  Note: Objective measures were completed at Evaluation unless otherwise noted.  HAND DOMINANCE: Right  ADLs: Patient limited with increased triggering as well as pain in gripping activities, turning a doorknob holding objects pulling pushing.  FUNCTIONAL OUTCOME MEASURES: Neck session  UPPER EXTREMITY ROM:     Active ROM Right eval Left eval  Shoulder flexion    Shoulder abduction    Shoulder adduction    Shoulder extension    Shoulder internal rotation    Shoulder external rotation    Elbow flexion    Elbow extension    Wrist flexion 62 72  Wrist extension    Wrist ulnar deviation    Wrist radial deviation    Wrist pronation    Wrist supination    (Blank rows = not tested)  Active ROM Right eval Left eval  Thumb MCP (0-60)    Thumb IP (0-80)    Thumb Radial abd/add (0-55)     Thumb Palmar abd/add (0-45)     Thumb Opposition to Small Finger     Index MCP (0-90) 85 80   Index PIP (0-100) 90  95  Index DIP (0-70)      Long MCP (0-90) 75  85   Long PIP (0-100) 90  ext -15  95 et -10  Long DIP (0-70)      Ring MCP (0-90)  85  90  Ring PIP (0-100)  90  95  Ring DIP (0-70)      Little MCP (0-90)  90 90   Little PIP (0-100)  85  95  Little DIP (0-70)      (Blank rows = not tested)   HAND FUNCTION: Grip strength: Right: 30 lbs; Left: 30 lbs, Lateral pinch: Right: 10 lbs, Left: 13 lbs, and 3 point pinch: Right: 9 lbs, Left: 6 lbs  COORDINATION: Limited because of triggering when attempting to bend or flex third digit SENSATION: Denies any sensory issues  EDEMA: A1 pulleys tender in the large  COGNITION: Overall cognitive status: Within functional limits for tasks assessed     TREATMENT DATE: 08/21/23                                                                                                                            Modalities: Contrast bath:  Time: 8 Location: Done to bilateral hands  to  decrease Pain and inflammation and decrease stiffness prior to review of home exercises  Fabricated custom MC block splints for bilateral third digits to wear at nighttime as well as during the day with composite flexion or fisting activities.  That she cannot modify or avoid.   Patient educated in donning and doffing as well as wearing time. Reviewed precautions.  After contrast patient can do soft tissue massage rolling of the right foam roller for soft tissue massage Followed by blocked MC flexion with PIP extension 10 reps Intrinsic assist blocked 10 reps Opposition to all digits 10 reps  Reviewed with patient modifications to use palm or larger objects to carry or hold objects.  As well as enlarging her grips and handles.        PATIENT EDUCATION: Education details: findings of eval and HEP  Person educated: Patient Education method: Explanation, Demonstration, Tactile cues, Verbal cues, and Handouts Education comprehension: verbalized understanding, returned demonstration, verbal cues required, and needs further education    GOALS: Goals reviewed with patient? Yes  LONG TERM GOALS: Target date: 6 wks  Patient be independent home program to increase motion and decrease pain to less than 3/10. Baseline: Patient no knowledge of home program.  Pain and tenderness 8-9/10.  Triggering several times in the clinic and reported during the day. Goal status: INITIAL  2.  Patient's passive range of motion to bilateral third digits improved to touching palm with no increased pain or triggering. Baseline: Third digit right hand MC 75 and PIP 90.  Left third MCP 85 and PIP 95.  But triggered sensation.  Tenderness and pain 8-9/10.  Patient also showing decreased PIP extension in bilateral thirds -10 to -15 degrees Goal status: INITIAL  3.  Patient verbalize 2-3 modifications and adaptations to decrease triggering as well as pain in bilateral hands. Baseline: Patient no knowledge.  Pain  is 8-9/10 over bilateral third digits as well as A1 pulley.  Triggering several times during the day as well as in session.   Goal status: INITIAL  4.  Patient's show composite flexion touching palm to use in ADLs and IADLs with less than 2 episodes of triggering a day Baseline: Patient with several times during the day or every time attempting to do composite flexion triggering at bilateral third digits.  Pain 8-9/10 pain.  Decreased range of motion in bilateral digits Goal status: INITIAL  5.  Grip and prevention strength improved to within normal limits for her age with no increased pain and symptoms to return to prior level of function Baseline: Grip 30 pounds left and right hand lateral pinch on the right 10 pounds left 13 pounds and 3 point pinch 9 and left 6 pounds Goal status: INITIAL    ASSESSMENT:  CLINICAL IMPRESSION: Patient seen today for occupational therapy evaluation for bilateral third digit trigger fingers.  Patient reports started in December 24.  Patient not able to get any shots because of allergic reactions and diabetic.  Patient pain 8-9/10 over bilateral third digits as well as A1 pulleys.  Patient showing in session several episodes of triggering.  Also report every attempt of composite fist third digits triggering or locking.  With worse in the morning.  Patient with decreased flexion in all digits as well as decreased PIP extension in bilateral third digits with -10 to -15 degrees.  Grip and prehension strength decreased in bilateral hands.  Limiting patient's functional use and ADLs and IADLs.  Patient can benefit from skilled OT services  to decrease pain and inflammation and increase motion and strength as well as joint protection and modification education, splinting and modalities to return to prior level of function.  PERFORMANCE DEFICITS: in functional skills including ADLs, IADLs, ROM, strength, pain, flexibility, decreased knowledge of use of DME, and UE  functional use,   and psychosocial skills including environmental adaptation and routines and behaviors.   IMPAIRMENTS: are limiting patient from ADLs, IADLs, rest and sleep, play, leisure, and social participation.   COMORBIDITIES: has no other co-morbidities that affects occupational performance. Patient will benefit from skilled OT to address above impairments and improve overall function.  MODIFICATION OR ASSISTANCE TO COMPLETE EVALUATION: No modification of tasks or assist necessary to complete an evaluation.  OT OCCUPATIONAL PROFILE AND HISTORY: Problem focused assessment: Including review of records relating to presenting problem.  CLINICAL DECISION MAKING: LOW - limited treatment options, no task modification necessary  REHAB POTENTIAL: Good for goals  EVALUATION COMPLEXITY: Low      PLAN:  OT FREQUENCY: 1-2x/week  OT DURATION: 6 weeks  PLANNED INTERVENTIONS: 97168 OT Re-evaluation, 97535 self care/ADL training, 16109 therapeutic exercise, 97530 therapeutic activity, 97140 manual therapy, 97035 ultrasound, 97018 paraffin, 60454 fluidotherapy, 97034 contrast bath, 97033 iontophoresis, orthotics, joint protection and modifications     CONSULTED AND AGREED WITH PLAN OF CARE: Patient     Heloise Lobo, OT 08/21/2023, 5:15 PM

## 2023-08-25 ENCOUNTER — Ambulatory Visit: Admitting: Occupational Therapy

## 2023-08-25 DIAGNOSIS — M65331 Trigger finger, right middle finger: Secondary | ICD-10-CM | POA: Diagnosis not present

## 2023-08-25 DIAGNOSIS — M25641 Stiffness of right hand, not elsewhere classified: Secondary | ICD-10-CM

## 2023-08-25 DIAGNOSIS — M79641 Pain in right hand: Secondary | ICD-10-CM

## 2023-08-25 DIAGNOSIS — M79642 Pain in left hand: Secondary | ICD-10-CM

## 2023-08-25 NOTE — Therapy (Signed)
 OUTPATIENT OCCUPATIONAL THERAPY ORTHO TREATMENT  Patient Name: Kathryn Owen MRN: [id number] DOB:[birth year], 78 y.o., female Today's Date: 08/25/2023  PCP: Dr Ross MART PROVIDER: Dr Kathlynn  END OF SESSION:  OT End of Session - 08/25/23 0817     Visit Number 2    Number of Visits 12    Date for OT Re-Evaluation 10/02/23    OT Start Time 0817    OT Stop Time 0857    OT Time Calculation (min) 40 min    Activity Tolerance Patient tolerated treatment well    Behavior During Therapy WFL for tasks assessed/performed          Past Medical History:  Diagnosis Date   Allergy history unknown    Arthritis    Asthma    Carpal tunnel syndrome    Fatty liver    GERD (gastroesophageal reflux disease)    Herpes zoster    HLD (hyperlipidemia)    mixed   HTN (hypertension)    Hypercalcemia    Hyperparathyroidism    secondary   Insomnia    Osteopenia    Rosacea    Vitamin B deficiency    Vitamin D deficiency    Past Surgical History:  Procedure Laterality Date   CARDIOVASCULAR STRESS TEST     nml    CESAREAN SECTION     x3   Patient Active Problem List   Diagnosis Date Noted   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  He has stilly locks on but I was pushing my great-granddaughter on a rocking chair.  I still need to get the foam handles.  But I could touch the steering wheel with no pain. Pt accompanied by: self  PERTINENT HISTORY: Assessment/Plan: 07/08/23 Dr Kathlynn note  Assessment & Plan Trigger finger  Bilateral trigger finger  affects both hands, causing locking of the middle fingers daily, especially in the morning, for over five months. She cannot tolerate steroid injections due to adverse reactions. Hand therapy and paraffin baths are considered as alternative treatments. Surgery remains an option if conservative measures fail, with minimal risk for complications. She will be referred to hand therapy at [facility] in [place] with therapist Deland. Paraffin bath therapy is considered to increase blood flow and reduce pain. Reassessment is planned in one month to evaluate the effectiveness of hand therapy. If symptoms persist, surgical intervention to release the A1 pulley will be discussed.  Dupuytren's contracture  A fibrous band under the skin suggests Dupuytren's contracture, in addition to trigger finger.  Diabetes mellitus without complications  Diabetes is well-controlled with an A1c of 5.5. Blood sugar levels are managed through diet without medication. Diagnoses and all orders for this visit:  Trigger middle finger of left hand - Ambulatory Referral to Occupational Therapy  Trigger middle finger of right hand - Ambulatory Referral to Occupational Therapy  Controlled type 2 diabetes mellitus with stage 3 chronic kidney disease, with long-term current use of insulin (CMS/HHS-HCC)   PRECAUTIONS: None    WEIGHT BEARING RESTRICTIONS: No  PAIN:  Are you having  pain?  Pain 6/10 in the mornings as well as during the day when triggering third digits and over the A1 pulleys  FALLS: Has patient fallen in last 6 months? No  LIVING ENVIRONMENT: Lives with: lives alone   PLOF: Prior to December no triggering.  Stiffness in the fingers.  Patient independent doing housework and cooking and laundry.  Takes care of her 42-year-old great grand daughter in the afternoons 1-5.  Drive  PATIENT GOALS: I want to get the trigger fingers better and the locking and the pain so I do not need  surgery.  NEXT MD VISIT: 4 to 6 weeks  OBJECTIVE:  Note: Objective measures were completed at Evaluation unless otherwise noted.  HAND DOMINANCE: Right  ADLs: Patient limited with increased triggering as well as pain in gripping activities, turning a doorknob holding objects pulling pushing.  FUNCTIONAL OUTCOME MEASURES: Neck session  UPPER EXTREMITY ROM:     Active ROM Right eval Left eval  Shoulder flexion    Shoulder abduction    Shoulder adduction    Shoulder extension    Shoulder internal rotation    Shoulder external rotation    Elbow flexion    Elbow extension    Wrist flexion 62 72  Wrist extension    Wrist ulnar deviation    Wrist radial deviation    Wrist pronation    Wrist supination    (Blank rows = not tested)  Active ROM Right eval Left eval  Thumb MCP (0-60)    Thumb IP (0-80)    Thumb Radial abd/add (0-55)     Thumb Palmar abd/add (0-45)     Thumb Opposition to Small Finger     Index MCP (0-90) 85 80   Index PIP (0-100) 90  95  Index DIP (0-70)      Long MCP (0-90) 75  85   Long PIP (0-100) 90  ext -15  95 et -10  Long DIP (0-70)      Ring MCP (0-90)  85  90  Ring PIP (0-100)  90  95  Ring DIP (0-70)      Little MCP (0-90)  90 90   Little PIP (0-100)  85  95  Little DIP (0-70)      (Blank rows = not tested)   HAND FUNCTION: Grip strength: Right: 30 lbs; Left: 30 lbs, Lateral pinch: Right: 10 lbs, Left: 13 lbs, and 3 point pinch: Right: 9 lbs, Left: 6 lbs  COORDINATION: Limited because of triggering when attempting to bend or flex third digit SENSATION: Denies any sensory issues  EDEMA: A1 pulleys tender in the large  COGNITION: Overall cognitive status: Within functional limits for tasks assessed     TREATMENT DATE: 08/25/23         Patient with less pain over the A1 pulleys decreased to 5/10.  Modalities: Contrast bath:  Time: 8 Location: Done to bilateral hands to decrease Pain and inflammation and decrease stiffness prior to review of home exercises  Patient needed extra padding at custom [facility] block splints to decrease pressure especially over the dorsal proximal phalanges -for bilateral third digits -continue to wear at nighttime as well as during the day with composite flexion or fisting activities.  That she cannot modify or avoid.   Patient educated in donning and doffing as well as wearing time again Reviewed precautions.  Done in the clinic contrast 8 minutes minutes alternating heat and cold 2 and 1 minutes to decrease inflammation and decrease stiffness prior to soft tissue massage.   Graston tool #2 done over for brushing and sweeping over the volar digits.  As well as palm Reviewed with patient soft tissue massage rolling of the right foam roller for soft tissue massage of the volar hands as well as forearm with light stretch for digits and hyperextension 15 reps Followed by blocked MC flexion with PIP extension 10 reps AROM Intrinsic assist blocked 10 reps changed to passive range of motion With a light composite passive range of motion pain-free Opposition to all digits 10 reps   Patient pushed great-granddaughter in the rocking chair gripping on. Reviewed with patient modifications to use palm or larger objects to carry or hold objects.  As well as enlarging her grips and handles.         PATIENT EDUCATION: Education details: findings of eval and HEP  Person educated: Patient Education method: Explanation, Demonstration, Tactile cues, Verbal cues, and Handouts Education comprehension: verbalized understanding, returned demonstration, verbal cues required, and needs further education    GOALS: Goals reviewed with patient? Yes  LONG TERM GOALS: Target date: 6 wks  Patient be independent home program to increase motion and decrease pain to less than  3/10. Baseline: Patient no knowledge of home program.  Pain and tenderness 8-9/10.  Triggering several times in the clinic and reported during the day. Goal status: INITIAL  2.  Patient's passive range of motion to bilateral third digits improved to touching palm with no increased pain or triggering. Baseline: Third digit right hand MC 75 and PIP 90.  Left third MCP 85 and PIP 95.  But triggered sensation.  Tenderness and pain 8-9/10.  Patient also showing decreased PIP extension in bilateral thirds -10 to -15 degrees Goal status: INITIAL  3.  Patient verbalize 2-3 modifications and adaptations to decrease triggering as well as pain in bilateral hands. Baseline: Patient no knowledge.  Pain is 8-9/10 over bilateral third digits as well as A1 pulley.  Triggering several times during the day as well as in session.   Goal status: INITIAL  4.  Patient's show composite flexion touching palm to use in ADLs and IADLs with less than 2 episodes of triggering a day Baseline: Patient with several times during the day or every time attempting to do composite flexion triggering at bilateral third digits.  Pain 8-9/10 pain.  Decreased range of motion in bilateral digits Goal status: INITIAL  5.  Grip and prevention strength improved to within normal limits for her age with no increased pain and symptoms to return to prior level of function Baseline: Grip 30 pounds left and right hand lateral pinch on the right 10 pounds left 13 pounds and 3 point pinch 9 and left 6 pounds Goal status: INITIAL    ASSESSMENT:  CLINICAL IMPRESSION: Patient seen today for occupational therapy for bilateral third digit  trigger fingers.  Patient reports started in December 24.  Patient not able to get any shots because of allergic reactions and diabetic.  Patient pain 8-9/10 over bilateral third digits as well as A1 pulleys.  Patient showing in session several episodes of triggering.  Also report every attempt of composite fist  third digits triggering or locking.  With worse in the morning.  Patient with decreased flexion in all digits as well as decreased PIP extension in bilateral third digits with -10 to -15 degrees.  Grip and prehension strength decreased in bilateral hands. NOW patient arrived with pain decreased to 5/10.  Patient was able to modify some of her activities as well as doing home program.  Can see a difference.  Needed some padding with the [facility] block splints and review of home exercises as well as reinforce modifications.  Limiting patient's functional use and ADLs and IADLs.  Patient can benefit from skilled OT services to decrease pain and inflammation and increase motion and strength as well as joint protection and modification education, splinting and modalities to return to prior level of function.  PERFORMANCE DEFICITS: in functional skills including ADLs, IADLs, ROM, strength, pain, flexibility, decreased knowledge of use of DME, and UE functional use,   and psychosocial skills including environmental adaptation and routines and behaviors.   IMPAIRMENTS: are limiting patient from ADLs, IADLs, rest and sleep, play, leisure, and social participation.   COMORBIDITIES: has no other co-morbidities that affects occupational performance. Patient will benefit from skilled OT to address above impairments and improve overall function.  MODIFICATION OR ASSISTANCE TO COMPLETE EVALUATION: No modification of tasks or assist necessary to complete an evaluation.  OT OCCUPATIONAL PROFILE AND HISTORY: Problem focused assessment: Including review of records relating to presenting problem.  CLINICAL DECISION MAKING: LOW - limited treatment options, no task modification necessary  REHAB POTENTIAL: Good for goals  EVALUATION COMPLEXITY: Low      PLAN:  OT FREQUENCY: 1-2x/week  OT DURATION: 6 weeks  PLANNED INTERVENTIONS: 97168 OT Re-evaluation, 97535 self care/ADL training, 02889 therapeutic exercise, 97530  therapeutic activity, 97140 manual therapy, 97035 ultrasound, 97018 paraffin, 02960 fluidotherapy, 97034 contrast bath, 97033 iontophoresis, orthotics, joint protection and modifications     CONSULTED AND AGREED WITH PLAN OF CARE: Patient     Ancel Peters, OTR/L,CLT 08/25/2023, 8:59 AM

## 2023-08-28 ENCOUNTER — Ambulatory Visit: Admitting: Occupational Therapy

## 2023-08-28 ENCOUNTER — Encounter: Admitting: Occupational Therapy

## 2023-08-28 DIAGNOSIS — M65331 Trigger finger, right middle finger: Secondary | ICD-10-CM

## 2023-08-28 DIAGNOSIS — M79642 Pain in left hand: Secondary | ICD-10-CM

## 2023-08-28 DIAGNOSIS — M25641 Stiffness of right hand, not elsewhere classified: Secondary | ICD-10-CM

## 2023-08-28 DIAGNOSIS — M79641 Pain in right hand: Secondary | ICD-10-CM

## 2023-08-28 NOTE — Therapy (Signed)
 OUTPATIENT OCCUPATIONAL THERAPY ORTHO TREATMENT  Patient Name: Kathryn Owen MRN: [id number] DOB:[birth year], 78 y.o., female Today's Date: 08/28/2023  PCP: Dr Ross MART PROVIDER: Dr Kathlynn  END OF SESSION:  OT End of Session - 08/28/23 0821     Visit Number 3    Number of Visits 12    Date for OT Re-Evaluation 10/02/23    OT Start Time 0818    Activity Tolerance Patient tolerated treatment well    Behavior During Therapy [facility] for tasks assessed/performed          Past Medical History:  Diagnosis Date   Allergy history unknown    Arthritis    Asthma    Carpal tunnel syndrome    Fatty liver    GERD (gastroesophageal reflux disease)    Herpes zoster    HLD (hyperlipidemia)    mixed   HTN (hypertension)    Hypercalcemia    Hyperparathyroidism    secondary   Insomnia    Osteopenia    Rosacea    Vitamin B deficiency    Vitamin D deficiency    Past Surgical History:  Procedure Laterality Date   CARDIOVASCULAR STRESS TEST     nml    CESAREAN SECTION     x3   Patient Active Problem List   Diagnosis Date Noted   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   HYPERLIPIDEMIA-MIXED 01/10/2009   ESSENTIAL HYPERTENSION, BENIGN 01/10/2009    ONSET DATE: Dec 24  REFERRING DIAG: bilateral 3rd digits trigger fingers   THERAPY DIAG:  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 She cannot tolerate  steroid injections due to adverse reactions. Hand therapy and paraffin baths are considered as alternative treatments. Surgery remains an option if conservative measures fail, with minimal risk for complications. She will be referred to hand therapy at [facility] in [place] with therapist Deland. Paraffin bath therapy is considered to increase blood flow and reduce pain. Reassessment is planned in one month to evaluate the effectiveness of hand therapy. If symptoms persist, surgical intervention to release the A1 pulley will be discussed.  Dupuytren's contracture  A fibrous band under the skin suggests Dupuytren's contracture, in addition to trigger finger.  Diabetes mellitus without complications  Diabetes is well-controlled with an A1c of 5.5. Blood sugar levels are managed through diet without medication. Diagnoses and all orders for this visit:  Trigger middle finger of left hand - Ambulatory Referral to Occupational Therapy  Trigger middle finger of right hand - Ambulatory Referral to Occupational Therapy  Controlled type 2 diabetes mellitus with stage 3 chronic kidney disease, with long-term current use of insulin (CMS/HHS-HCC)   PRECAUTIONS: None    WEIGHT BEARING RESTRICTIONS: No  PAIN:  Are you having pain?  Pain 8/10 in the mornings as well as during the day when triggering third digits and over the A1 pulleys  FALLS: Has patient fallen in  last 6 months? No  LIVING ENVIRONMENT: Lives with: lives alone   PLOF: Prior to December no triggering.  Stiffness in the fingers.  Patient independent doing housework and cooking and laundry.  Takes care of her 45-year-old great grand daughter in the afternoons 1-5.  Drive  PATIENT GOALS: I want to get the trigger fingers better and the locking and the pain so I do not need surgery.  NEXT MD VISIT: 4 to 6 weeks  OBJECTIVE:  Note: Objective measures were completed at Evaluation unless otherwise noted.  HAND  DOMINANCE: Right  ADLs: Patient limited with increased triggering as well as pain in gripping activities, turning a doorknob holding objects pulling pushing.  FUNCTIONAL OUTCOME MEASURES: Neck session  UPPER EXTREMITY ROM:     Active ROM Right eval Left eval  Shoulder flexion    Shoulder abduction    Shoulder adduction    Shoulder extension    Shoulder internal rotation    Shoulder external rotation    Elbow flexion    Elbow extension    Wrist flexion 62 72  Wrist extension    Wrist ulnar deviation    Wrist radial deviation    Wrist pronation    Wrist supination    (Blank rows = not tested)  Active ROM Right eval Left eval  Thumb MCP (0-60)    Thumb IP (0-80)    Thumb Radial abd/add (0-55)     Thumb Palmar abd/add (0-45)     Thumb Opposition to Small Finger     Index MCP (0-90) 85 80   Index PIP (0-100) 90  95  Index DIP (0-70)      Long MCP (0-90) 75  85   Long PIP (0-100) 90  ext -15  95 et -10  Long DIP (0-70)      Ring MCP (0-90)  85  90  Ring PIP (0-100)  90  95  Ring DIP (0-70)      Little MCP (0-90)  90 90   Little PIP (0-100)  85  95  Little DIP (0-70)      (Blank rows = not tested)   HAND FUNCTION: Grip strength: Right: 30 lbs; Left: 30 lbs, Lateral pinch: Right: 10 lbs, Left: 13 lbs, and 3 point pinch: Right: 9 lbs, Left: 6 lbs  COORDINATION: Limited because of triggering when attempting to bend or flex third digit SENSATION: Denies any sensory issues  EDEMA: A1 pulleys tender in the large  COGNITION: Overall cognitive status: Within functional limits for tasks assessed     TREATMENT DATE: 08/28/23         Patient  over the R A1 pulleys 8/10.      L middle A 1 pulley 4/10       Was not able to do a lot of her home exercises since last time                                                                                                         Modalities: Contrast bath:  Time: 8 Location: Done  to bilateral hands to decrease Pain and  inflammation and decrease stiffness prior to review of home exercises  Patient needed again some changes at custom [facility] block splints to decrease pressure especially over the dorsal proximal phalanges -for bilateral third digits -adjusted strep length as well as 1 continuous mode of moleskin to decrease pressure  continue to wear at nighttime as well as during the day with to prevent composite flexion or fisting activities.  That she cannot modify or avoid.   Patient educated in donning and doffing as well as wearing time again Reviewed precautions.  Done in the clinic contrast 8 minutes minutes alternating heat and cold 2 and 1 minutes to decrease inflammation and decrease stiffness prior to soft tissue massage.   Graston tool #2 done over for brushing and sweeping over the volar digits.  As well as palm Reviewed with patient soft tissue massage rolling of the right foam roller for soft tissue massage of the volar hands as well as forearm with light stretch for digits and hyperextension 15 reps Followed by prayer stretch for composite extension. Patient needed mod to min verbal cueing tactile cueing to perform correctly Followed by  blocked MC flexion with PIP extension 10 reps AROM Intrinsic 10 reps passive range of motion pain free With a light composite passive range of motion pain-free Opposition to all digits 10 reps    Reviewed with patient modifications to use palm or larger objects to carry or hold objects.  As well as enlarging her grips and handles. Using foam roller to enlarge handles, picking up her pocketbook.  Grocery cart.  Driving.   Iontophoresis with dexamethasone initiated today small patch on bilateral third A1 pulleys Patient was educated on what to expect. Was able to increase to 2.0 current 19 minutes Patient tolerated well skin check done at the end no issues.     PATIENT EDUCATION: Education details: findings of eval and HEP  Person educated:  Patient Education method: Explanation, Demonstration, Tactile cues, Verbal cues, and Handouts Education comprehension: verbalized understanding, returned demonstration, verbal cues required, and needs further education    GOALS: Goals reviewed with patient? Yes  LONG TERM GOALS: Target date: 6 wks  Patient be independent home program to increase motion and decrease pain to less than 3/10. Baseline: Patient no knowledge of home program.  Pain and tenderness 8-9/10.  Triggering several times in the clinic and reported during the day. Goal status: INITIAL  2.  Patient's passive range of motion to bilateral third digits improved to touching palm with no increased pain or triggering. Baseline: Third digit right hand MC 75 and PIP 90.  Left third MCP 85 and PIP 95.  But triggered sensation.  Tenderness and pain 8-9/10.  Patient also showing decreased PIP extension in bilateral thirds -10 to -15 degrees Goal status: INITIAL  3.  Patient verbalize 2-3 modifications and adaptations to decrease triggering as well as pain in bilateral hands. Baseline: Patient no knowledge.  Pain is 8-9/10 over bilateral third digits as well as A1 pulley.  Triggering several times during the day as well as in session.   Goal status: INITIAL  4.  Patient's show composite flexion touching palm to use in ADLs and IADLs with less than 2 episodes of triggering a day Baseline: Patient with several times during the day or every time attempting to do composite flexion triggering at bilateral third digits.  Pain 8-9/10 pain.  Decreased range of motion in bilateral digits Goal status: INITIAL  5.  Grip and prevention strength improved to within normal limits for her age with no increased pain and symptoms to return to prior level of function Baseline: Grip 30 pounds left and right hand lateral pinch on the right 10 pounds left 13 pounds and 3 point pinch 9 and left 6 pounds Goal status: INITIAL    ASSESSMENT:  CLINICAL  IMPRESSION: Patient seen today for occupational therapy for bilateral third digit trigger fingers.  Patient reports started in December 24.  Patient not able to get any shots because of allergic reactions and diabetic.  Patient pain 8-9/10 over bilateral third digits as well as A1 pulleys.  Patient showing in session several episodes of triggering.  Also report every attempt of composite fist third digits triggering or locking.  With worse in the morning.  Patient with decreased flexion in all digits as well as decreased PIP extension in bilateral third digits with -10 to -15 degrees.  Grip and prehension strength decreased in bilateral hands. NOW patient arrived with increased pain since last time patient did not had time to do her home exercises.   Patient was able to modify some of her activities but had some questions.  Pain is less with functional use- needed some modifications and moleskin at the dorsal part of the [facility] block splints and review of home exercises as well as reinforce modifications.  Initiated iontophoresis today with dexamethasone.  Patient tolerated well.  Limiting patient's functional use and ADLs and IADLs.  Patient can benefit from skilled OT services to decrease pain and inflammation and increase motion and strength as well as joint protection and modification education, splinting and modalities to return to prior level of function.  PERFORMANCE DEFICITS: in functional skills including ADLs, IADLs, ROM, strength, pain, flexibility, decreased knowledge of use of DME, and UE functional use,   and psychosocial skills including environmental adaptation and routines and behaviors.   IMPAIRMENTS: are limiting patient from ADLs, IADLs, rest and sleep, play, leisure, and social participation.   COMORBIDITIES: has no other co-morbidities that affects occupational performance. Patient will benefit from skilled OT to address above impairments and improve overall function.  MODIFICATION OR  ASSISTANCE TO COMPLETE EVALUATION: No modification of tasks or assist necessary to complete an evaluation.  OT OCCUPATIONAL PROFILE AND HISTORY: Problem focused assessment: Including review of records relating to presenting problem.  CLINICAL DECISION MAKING: LOW - limited treatment options, no task modification necessary  REHAB POTENTIAL: Good for goals  EVALUATION COMPLEXITY: Low      PLAN:  OT FREQUENCY: 1-2x/week  OT DURATION: 6 weeks  PLANNED INTERVENTIONS: 97168 OT Re-evaluation, 97535 self care/ADL training, 02889 therapeutic exercise, 97530 therapeutic activity, 97140 manual therapy, 97035 ultrasound, 97018 paraffin, 02960 fluidotherapy, 97034 contrast bath, 97033 iontophoresis, orthotics, joint protection and modifications     CONSULTED AND AGREED WITH PLAN OF CARE: Patient     Ancel Peters, OTR/L,CLT 08/28/2023, 8:22 AM

## 2023-09-01 ENCOUNTER — Ambulatory Visit: Admitting: Occupational Therapy

## 2023-09-01 DIAGNOSIS — M79642 Pain in left hand: Secondary | ICD-10-CM

## 2023-09-01 DIAGNOSIS — M79641 Pain in right hand: Secondary | ICD-10-CM

## 2023-09-01 DIAGNOSIS — M65331 Trigger finger, right middle finger: Secondary | ICD-10-CM

## 2023-09-01 DIAGNOSIS — M25641 Stiffness of right hand, not elsewhere classified: Secondary | ICD-10-CM

## 2023-09-01 NOTE — Therapy (Signed)
 OUTPATIENT OCCUPATIONAL THERAPY ORTHO TREATMENT  Patient Name: Kathryn Owen MRN: [id number] DOB:1945-05-02, 78 y.o., female Today's Date: 09/01/2023  PCP: Dr Ross MART PROVIDER: Dr Kathlynn  END OF SESSION:  OT End of Session - 09/01/23 1135     Visit Number 4    Number of Visits 12    Date for OT Re-Evaluation 10/02/23    OT Start Time 1119    OT Stop Time 1208    OT Time Calculation (min) 49 min    Activity Tolerance Patient tolerated treatment well    Behavior During Therapy WFL for tasks assessed/performed          Past Medical History:  Diagnosis Date   Allergy history unknown    Arthritis    Asthma    Carpal tunnel syndrome    Fatty liver    GERD (gastroesophageal reflux disease)    Herpes zoster    HLD (hyperlipidemia)    mixed   HTN (hypertension)    Hypercalcemia    Hyperparathyroidism    secondary   Insomnia    Osteopenia    Rosacea    Vitamin B deficiency    Vitamin D deficiency    Past Surgical History:  Procedure Laterality Date   CARDIOVASCULAR STRESS TEST     nml    CESAREAN SECTION     x3   Patient Active Problem List   Diagnosis Date Noted   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  I was pushing it down and it hurts today.  The splints feels much better Pt accompanied by: self  PERTINENT HISTORY: Assessment/Plan: 07/08/23 Dr Kathlynn note  Assessment & Plan Trigger finger  Bilateral trigger finger affects both hands, causing locking of the middle fingers daily, especially in the morning, for over five  months. She cannot tolerate steroid injections due to adverse reactions. Hand therapy and paraffin baths are considered as alternative treatments. Surgery remains an option if conservative measures fail, with minimal risk for complications. She will be referred to hand therapy at [facility] in [place] with therapist Deland. Paraffin bath therapy is considered to increase blood flow and reduce pain. Reassessment is planned in one month to evaluate the effectiveness of hand therapy. If symptoms persist, surgical intervention to release the A1 pulley will be discussed.  Dupuytren's contracture  A fibrous band under the skin suggests Dupuytren's contracture, in addition to trigger finger.  Diabetes mellitus without complications  Diabetes is well-controlled with an A1c of 5.5. Blood sugar levels are managed through diet without medication. Diagnoses and all orders for this visit:  Trigger middle finger of left hand - Ambulatory Referral to Occupational Therapy  Trigger middle finger of right hand - Ambulatory Referral to Occupational Therapy  Controlled type 2 diabetes mellitus with stage 3 chronic kidney disease, with long-term current use of insulin (CMS/HHS-HCC)   PRECAUTIONS: None    WEIGHT BEARING RESTRICTIONS: No  PAIN:  Are you having pain?  Pain 6-8/10 on R 3rd A1pulley and L 4/10   FALLS: Has patient fallen in last 6  months? No  LIVING ENVIRONMENT: Lives with: lives alone   PLOF: Prior to December no triggering.  Stiffness in the fingers.  Patient independent doing housework and cooking and laundry.  Takes care of her 35-year-old great grand daughter in the afternoons 1-5.  Drive  PATIENT GOALS: I want to get the trigger fingers better and the locking and the pain so I do not need surgery.  NEXT MD VISIT: 4 to 6 weeks  OBJECTIVE:  Note: Objective measures were completed at Evaluation unless otherwise noted.  HAND DOMINANCE: Right  ADLs: Patient  limited with increased triggering as well as pain in gripping activities, turning a doorknob holding objects pulling pushing.  FUNCTIONAL OUTCOME MEASURES: Neck session  UPPER EXTREMITY ROM:     Active ROM Right eval Left eval  Shoulder flexion    Shoulder abduction    Shoulder adduction    Shoulder extension    Shoulder internal rotation    Shoulder external rotation    Elbow flexion    Elbow extension    Wrist flexion 62 72  Wrist extension    Wrist ulnar deviation    Wrist radial deviation    Wrist pronation    Wrist supination    (Blank rows = not tested)  Active ROM Right eval Left eval  Thumb MCP (0-60)    Thumb IP (0-80)    Thumb Radial abd/add (0-55)     Thumb Palmar abd/add (0-45)     Thumb Opposition to Small Finger     Index MCP (0-90) 85 80   Index PIP (0-100) 90  95  Index DIP (0-70)      Long MCP (0-90) 75  85   Long PIP (0-100) 90  ext -15  95 et -10  Long DIP (0-70)      Ring MCP (0-90)  85  90  Ring PIP (0-100)  90  95  Ring DIP (0-70)      Little MCP (0-90)  90 90   Little PIP (0-100)  85  95  Little DIP (0-70)      (Blank rows = not tested)   HAND FUNCTION: Grip strength: Right: 30 lbs; Left: 30 lbs, Lateral pinch: Right: 10 lbs, Left: 13 lbs, and 3 point pinch: Right: 9 lbs, Left: 6 lbs  COORDINATION: Limited because of triggering when attempting to bend or flex third digit SENSATION: Denies any sensory issues  EDEMA: A1 pulleys tender in the large  COGNITION: Overall cognitive status: Within functional limits for tasks assessed     TREATMENT DATE: 09/01/23         Patient  over the R A1 pulleys 6-8/10.      L middle A 1 pulley 4/10       I think I did the exercises for my right finger too much and too hard.  You need to show me again L one feels good  Modalities: Contrast bath:  Time: 8 Location: Done to bilateral hands to decrease Pain and inflammation and decrease stiffness prior to review of home exercises  Patient   to cont with custom MC block splints -for bilateral third digits --continue to wear at nighttime as well as during the day with to prevent composite flexion or fisting activities.  That she cannot modify or avoid.   Patient educated in donning and doffing as well as wearing time again Reviewed precautions. Tolerated better   Done in the clinic contrast 8 minutes minutes alternating heat and cold 2 and 1  minutes to decrease inflammation and decrease stiffness prior to soft tissue massage.   Graston tool #2 done over for brushing and sweeping over the volar digits.  As well as palm Reviewed with patient soft tissue massage rolling of the right foam roller for soft tissue massage of the volar hands as well as forearm with light stretch for digits and hyperextension 15 reps Followed by prayer stretch for composite extension. Patient needed mod to min verbal cueing tactile cueing to perform correctly Followed by  blocked MC flexion with PIP extension 10 reps AROM Intrinsic 10 reps passive range of motion pain free - REVIEW again  pt done composite and forced it  With a light composite passive range of motion pain-free - L better than R  Opposition to all digits 10 reps    Reviewed with patient modifications to use palm or larger objects to carry or hold objects.  As well as enlarging her grips and handles. Using foam roller to enlarge handles, picking up her pocketbook.  Grocery cart.  Driving.   Iontophoresis with dexamethasone  small patch on bilateral third A1 pulleys Patient was educated on what to expect. Was able to increase to 1.7 current 21 minutes Patient tolerated well - pt to keep patches on for hour today after leaving      PATIENT EDUCATION: Education details: findings of eval and HEP  Person educated: Patient Education method: Explanation, Demonstration, Tactile cues, Verbal cues, and Handouts Education comprehension: verbalized understanding, returned demonstration, verbal  cues required, and needs further education    GOALS: Goals reviewed with patient? Yes  LONG TERM GOALS: Target date: 6 wks  Patient be independent home program to increase motion and decrease pain to less than 3/10. Baseline: Patient no knowledge of home program.  Pain and tenderness 8-9/10.  Triggering several times in the clinic and reported during the day. Goal status: INITIAL  2.  Patient's passive range of motion to bilateral third digits improved to touching palm with no increased pain or triggering. Baseline: Third digit right hand MC 75 and PIP 90.  Left third MCP 85 and PIP 95.  But triggered sensation.  Tenderness and pain 8-9/10.  Patient also showing decreased PIP extension in bilateral thirds -10 to -15 degrees Goal status: INITIAL  3.  Patient verbalize 2-3 modifications and adaptations to decrease triggering as well as pain in bilateral hands. Baseline: Patient no knowledge.  Pain is 8-9/10 over bilateral third digits as well as A1 pulley.  Triggering several times during the day as well as in session.   Goal status: INITIAL  4.  Patient's show composite flexion touching palm to use in ADLs and IADLs with less than 2 episodes of triggering a day Baseline: Patient with several times during the day or every time attempting to do composite flexion triggering at bilateral third digits.  Pain 8-9/10 pain.  Decreased range of motion in bilateral digits Goal status: INITIAL  5.  Grip and prevention strength improved to within normal limits for her age with no increased pain and symptoms to return to prior level of function Baseline: Grip 30 pounds left and right hand lateral pinch on the right 10 pounds left 13 pounds and 3 point pinch 9 and left 6 pounds Goal status: INITIAL    ASSESSMENT:  CLINICAL IMPRESSION: Patient seen today for occupational therapy for bilateral third digit trigger fingers.  Patient reports started in December 24.  Patient not able to get any shots  because of allergic reactions  and diabetic.  Patient pain 8-9/10 over bilateral third digits as well as A1 pulleys.  Patient showing in session several episodes of triggering.  Also report every attempt of composite fist third digits triggering or locking.  With worse in the morning.  Patient with decreased flexion in all digits as well as decreased PIP extension in bilateral third digits with -10 to -15 degrees.  Grip and prehension strength decreased in bilateral hands. NOW patient arrived with continued increased pain on the right third A1 pulley.  Left doing really well.  Passive range of motion in the left third digit improving more than right.  Reviewed with patient again passive range of motion pain-free.  - review of home exercises as well as reinforce modifications.  Done second session of iontophoresis today with dexamethasone.  Patient tolerated well.  Limiting patient's functional use and ADLs and IADLs.  Patient can benefit from skilled OT services to decrease pain and inflammation and increase motion and strength as well as joint protection and modification education, splinting and modalities to return to prior level of function.  PERFORMANCE DEFICITS: in functional skills including ADLs, IADLs, ROM, strength, pain, flexibility, decreased knowledge of use of DME, and UE functional use,   and psychosocial skills including environmental adaptation and routines and behaviors.   IMPAIRMENTS: are limiting patient from ADLs, IADLs, rest and sleep, play, leisure, and social participation.   COMORBIDITIES: has no other co-morbidities that affects occupational performance. Patient will benefit from skilled OT to address above impairments and improve overall function.  MODIFICATION OR ASSISTANCE TO COMPLETE EVALUATION: No modification of tasks or assist necessary to complete an evaluation.  OT OCCUPATIONAL PROFILE AND HISTORY: Problem focused assessment: Including review of records relating to  presenting problem.  CLINICAL DECISION MAKING: LOW - limited treatment options, no task modification necessary  REHAB POTENTIAL: Good for goals  EVALUATION COMPLEXITY: Low      PLAN:  OT FREQUENCY: 1-2x/week  OT DURATION: 6 weeks  PLANNED INTERVENTIONS: 97168 OT Re-evaluation, 97535 self care/ADL training, 02889 therapeutic exercise, 97530 therapeutic activity, 97140 manual therapy, 97035 ultrasound, 97018 paraffin, 02960 fluidotherapy, 97034 contrast bath, 97033 iontophoresis, orthotics, joint protection and modifications     CONSULTED AND AGREED WITH PLAN OF CARE: Patient     Ancel Peters, OTR/L,CLT 09/01/2023, 1:32 PM

## 2023-09-04 ENCOUNTER — Ambulatory Visit: Attending: Orthopedic Surgery | Admitting: Occupational Therapy

## 2023-09-04 DIAGNOSIS — M65331 Trigger finger, right middle finger: Secondary | ICD-10-CM | POA: Insufficient documentation

## 2023-09-04 DIAGNOSIS — M79642 Pain in left hand: Secondary | ICD-10-CM | POA: Insufficient documentation

## 2023-09-04 DIAGNOSIS — M25641 Stiffness of right hand, not elsewhere classified: Secondary | ICD-10-CM | POA: Diagnosis present

## 2023-09-04 DIAGNOSIS — M65332 Trigger finger, left middle finger: Secondary | ICD-10-CM | POA: Insufficient documentation

## 2023-09-04 DIAGNOSIS — M25642 Stiffness of left hand, not elsewhere classified: Secondary | ICD-10-CM | POA: Diagnosis present

## 2023-09-04 DIAGNOSIS — M79641 Pain in right hand: Secondary | ICD-10-CM | POA: Insufficient documentation

## 2023-09-04 NOTE — Therapy (Signed)
 OUTPATIENT OCCUPATIONAL THERAPY ORTHO TREATMENT  Patient Name: Kathryn Owen MRN: [id number] DOB:1945/05/02, 78 y.o., female Today's Date: 09/04/2023  PCP: Dr Ross MART PROVIDER: Dr Kathlynn  END OF SESSION:  OT End of Session - 09/04/23 0827     Visit Number 5    Number of Visits 12    Date for OT Re-Evaluation 10/02/23    OT Start Time 0820    Activity Tolerance Patient tolerated treatment well    Behavior During Therapy [facility] for tasks assessed/performed          Past Medical History:  Diagnosis Date   Allergy history unknown    Arthritis    Asthma    Carpal tunnel syndrome    Fatty liver    GERD (gastroesophageal reflux disease)    Herpes zoster    HLD (hyperlipidemia)    mixed   HTN (hypertension)    Hypercalcemia    Hyperparathyroidism    secondary   Insomnia    Osteopenia    Rosacea    Vitamin B deficiency    Vitamin D deficiency    Past Surgical History:  Procedure Laterality Date   CARDIOVASCULAR STRESS TEST     nml    CESAREAN SECTION     x3   Patient Active Problem List   Diagnosis Date Noted   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   HYPERLIPIDEMIA-MIXED 01/10/2009   ESSENTIAL HYPERTENSION, BENIGN 01/10/2009    ONSET DATE: Dec 24  REFERRING DIAG: bilateral 3rd digits trigger fingers   THERAPY DIAG:  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 She cannot tolerate steroid injections due to adverse reactions. Hand therapy and  paraffin baths are considered as alternative treatments. Surgery remains an option if conservative measures fail, with minimal risk for complications. She will be referred to hand therapy at [facility] in [place] with therapist Deland. Paraffin bath therapy is considered to increase blood flow and reduce pain. Reassessment is planned in one month to evaluate the effectiveness of hand therapy. If symptoms persist, surgical intervention to release the A1 pulley will be discussed.  Dupuytren's contracture  A fibrous band under the skin suggests Dupuytren's contracture, in addition to trigger finger.  Diabetes mellitus without complications  Diabetes is well-controlled with an A1c of 5.5. Blood sugar levels are managed through diet without medication. Diagnoses and all orders for this visit:  Trigger middle finger of left hand - Ambulatory Referral to Occupational Therapy  Trigger middle finger of right hand - Ambulatory Referral to Occupational Therapy  Controlled type 2 diabetes mellitus with stage 3 chronic kidney disease, with long-term current use of insulin (CMS/HHS-HCC)   PRECAUTIONS: None    WEIGHT BEARING RESTRICTIONS: No  PAIN:  Are you having pain?  Pain 4/10 on R 3rd A1pulley and L 1/10   FALLS: Has patient fallen in last 6 months? No  LIVING ENVIRONMENT: Lives with: lives alone   PLOF: Prior to December no triggering.  Stiffness in the fingers.  Patient independent doing housework and cooking and laundry.  Takes care of her 23-year-old great grand daughter in the afternoons 1-5.  Drive  PATIENT GOALS: I want to get the trigger fingers better and the locking and the pain so I do not need surgery.  NEXT MD VISIT: 4 to 6 weeks  OBJECTIVE:  Note: Objective measures were completed at Evaluation unless otherwise noted.  HAND DOMINANCE: Right  ADLs: Patient limited with increased triggering as well as pain in gripping activities, turning a doorknob  holding objects pulling pushing.  FUNCTIONAL OUTCOME MEASURES: Neck session  UPPER EXTREMITY ROM:     Active ROM Right eval Left eval  Shoulder flexion    Shoulder abduction    Shoulder adduction    Shoulder extension    Shoulder internal rotation    Shoulder external rotation    Elbow flexion    Elbow extension    Wrist flexion 62 72  Wrist extension    Wrist ulnar deviation    Wrist radial deviation    Wrist pronation    Wrist supination    (Blank rows = not tested)  Active ROM Right eval Left eval  Thumb MCP (0-60)    Thumb IP (0-80)    Thumb Radial abd/add (0-55)     Thumb Palmar abd/add (0-45)     Thumb Opposition to Small Finger     Index MCP (0-90) 85 80   Index PIP (0-100) 90  95  Index DIP (0-70)      Long MCP (0-90) 75  85   Long PIP (0-100) 90  ext -15  95 et -10  Long DIP (0-70)      Ring MCP (0-90)  85  90  Ring PIP (0-100)  90  95  Ring DIP (0-70)      Little MCP (0-90)  90 90   Little PIP (0-100)  85  95  Little DIP (0-70)      (Blank rows = not tested)   HAND FUNCTION: Grip strength: Right: 30 lbs; Left: 30 lbs, Lateral pinch: Right: 10 lbs, Left: 13 lbs, and 3 point pinch: Right: 9 lbs, Left: 6 lbs  COORDINATION: Limited because of triggering when attempting to bend or flex third digit SENSATION: Denies any sensory issues  EDEMA: A1 pulleys tender in the large  COGNITION: Overall cognitive status: Within functional limits for tasks assessed     TREATMENT DATE: 09/04/23     Patient arrived with increased range of motion decrease pain     Patient  over the R A1 pulleys 4/10.      L middle A 1 pulley 1-2/10       Increase PROM - L touching palm   Modalities: Contrast bath:  Time: 8 Location: Done to bilateral hands to decrease Pain and inflammation and decrease stiffness prior to review of home exercises  Patient  to cont with custom MC block splints -for bilateral third digits --continue to wear at nighttime as well as during  the day with to prevent composite flexion or fisting activities.  That she cannot modify or avoid.    Done in the clinic contrast 8 minutes minutes alternating heat and cold 2 and 1 minutes to decrease inflammation and decrease stiffness prior to soft tissue massage.   Graston tool #2 done over for brushing and sweeping over the volar digits.  As well as palm Reviewed with patient soft tissue massage rolling of the right foam roller for soft tissue massage of the  volar hands/and volar digits as well as forearm with light stretch for digits and hyperextension 15 reps-need mod verbal cueing reviewed again Followed by prayer stretch for composite extension. Patient needed mod to min verbal cueing tactile cueing to perform correctly Followed by  blocked MC flexion with PIP extension 10 reps AROM Intrinsic 10 reps passive range of motion pain free - REVIEW again   With a light composite passive range of motion pain-free - L better than R  Opposition to all digits 10 reps    Patient to continue with modifications to use palm or larger objects to carry or hold objects.  As well as enlarging her grips and handles. Using foam roller to enlarge handles, picking up her pocketbook.  Grocery cart.  Driving.   Iontophoresis with dexamethasone  small patch on bilateral third A1 pulleys Patient was educated on what to expect. Was able to increase to 1.7 current 21 minutes on L  but R 0.5 current tolerated Patch on for an hour on the left.  Skin checked on the right tolerated well but appear patient had lotion on her hand.     PATIENT EDUCATION: Education details: findings of eval and HEP  Person educated: Patient Education method: Explanation, Demonstration, Tactile cues, Verbal cues, and Handouts Education comprehension: verbalized understanding, returned demonstration, verbal cues required, and needs further education    GOALS: Goals reviewed with patient? Yes  LONG TERM GOALS: Target date: 6  wks  Patient be independent home program to increase motion and decrease pain to less than 3/10. Baseline: Patient no knowledge of home program.  Pain and tenderness 8-9/10.  Triggering several times in the clinic and reported during the day. Goal status: INITIAL  2.  Patient's passive range of motion to bilateral third digits improved to touching palm with no increased pain or triggering. Baseline: Third digit right hand MC 75 and PIP 90.  Left third MCP 85 and PIP 95.  But triggered sensation.  Tenderness and pain 8-9/10.  Patient also showing decreased PIP extension in bilateral thirds -10 to -15 degrees Goal status: INITIAL  3.  Patient verbalize 2-3 modifications and adaptations to decrease triggering as well as pain in bilateral hands. Baseline: Patient no knowledge.  Pain is 8-9/10 over bilateral third digits as well as A1 pulley.  Triggering several times during the day as well as in session.   Goal status: INITIAL  4.  Patient's show composite flexion touching palm to use in ADLs and IADLs with less than 2 episodes of triggering a day Baseline: Patient with several times during the day or every time attempting to do composite flexion triggering at bilateral third digits.  Pain 8-9/10 pain.  Decreased range of motion in bilateral digits Goal status: INITIAL  5.  Grip and prevention strength improved to within normal limits for her age with no increased pain and symptoms to return to prior level of function Baseline: Grip 30 pounds left and right hand lateral pinch on the right 10 pounds left 13 pounds and 3 point pinch 9 and left 6 pounds Goal status: INITIAL    ASSESSMENT:  CLINICAL IMPRESSION: Patient seen today for occupational therapy for bilateral third digit trigger fingers.  Patient reports started in December 24.  Patient not able to get any shots because of allergic reactions and diabetic.  Patient pain 8-9/10 over bilateral third digits as well as A1 pulleys.  Patient  showing in session several episodes of triggering.  Also report every attempt of composite  fist third digits triggering or locking.  With worse in the morning.  Patient with decreased flexion in all digits as well as decreased PIP extension in bilateral third digits with -10 to -15 degrees.  Grip and prehension strength decreased in bilateral hands. NOW patient arrived decreased pain to a 4/10 on the right and 1-2/10 on the left. - Passive range of motion improving in bilateral DIP of PIP flexion as well as composite- reviewed with patient again passive range of motion pain-free-intrinsic a fist.  And making sure she goes into extension in between.  Reinforced no active range of motion. .3rd session of iontophoresis today with dexamethasone.  Patient tolerated well on the left.  Right appear pt  had lotion on.  Limiting patient's functional use and ADLs and IADLs.  Patient can benefit from skilled OT services to decrease pain and inflammation and increase motion and strength as well as joint protection and modification education, splinting and modalities to return to prior level of function.  PERFORMANCE DEFICITS: in functional skills including ADLs, IADLs, ROM, strength, pain, flexibility, decreased knowledge of use of DME, and UE functional use,   and psychosocial skills including environmental adaptation and routines and behaviors.   IMPAIRMENTS: are limiting patient from ADLs, IADLs, rest and sleep, play, leisure, and social participation.   COMORBIDITIES: has no other co-morbidities that affects occupational performance. Patient will benefit from skilled OT to address above impairments and improve overall function.  MODIFICATION OR ASSISTANCE TO COMPLETE EVALUATION: No modification of tasks or assist necessary to complete an evaluation.  OT OCCUPATIONAL PROFILE AND HISTORY: Problem focused assessment: Including review of records relating to presenting problem.  CLINICAL DECISION MAKING: LOW -  limited treatment options, no task modification necessary  REHAB POTENTIAL: Good for goals  EVALUATION COMPLEXITY: Low      PLAN:  OT FREQUENCY: 1-2x/week  OT DURATION: 6 weeks  PLANNED INTERVENTIONS: 97168 OT Re-evaluation, 97535 self care/ADL training, 02889 therapeutic exercise, 97530 therapeutic activity, 97140 manual therapy, 97035 ultrasound, 97018 paraffin, 02960 fluidotherapy, 97034 contrast bath, 97033 iontophoresis, orthotics, joint protection and modifications     CONSULTED AND AGREED WITH PLAN OF CARE: Patient     Ancel Peters, OTR/L,CLT 09/04/2023, 8:28 AM

## 2023-09-08 ENCOUNTER — Ambulatory Visit: Admitting: Occupational Therapy

## 2023-09-08 ENCOUNTER — Encounter: Admitting: Occupational Therapy

## 2023-09-09 ENCOUNTER — Encounter: Admitting: Occupational Therapy

## 2023-09-12 ENCOUNTER — Ambulatory Visit: Admitting: Occupational Therapy

## 2023-09-12 DIAGNOSIS — M25641 Stiffness of right hand, not elsewhere classified: Secondary | ICD-10-CM

## 2023-09-12 DIAGNOSIS — M79642 Pain in left hand: Secondary | ICD-10-CM

## 2023-09-12 DIAGNOSIS — M65331 Trigger finger, right middle finger: Secondary | ICD-10-CM

## 2023-09-12 DIAGNOSIS — M79641 Pain in right hand: Secondary | ICD-10-CM

## 2023-09-13 NOTE — Therapy (Incomplete Revision)
 OUTPATIENT OCCUPATIONAL THERAPY ORTHO TREATMENT  Patient Name: Kathryn Owen MRN: [id number] DOB:Aug 04, 1945, 78 y.o., female Today's Date: 09/18/2023  PCP: Dr Ross MART PROVIDER: Dr Kathlynn  END OF SESSION:  OT End of Session - 09/18/23 1009     Visit Number 6    Number of Visits 12    Date for OT Re-Evaluation 10/02/23    OT Start Time 0815    OT Stop Time 0905    OT Time Calculation (min) 50 min    Activity Tolerance Patient tolerated treatment well    Behavior During Therapy WFL for tasks assessed/performed           Past Medical History:  Diagnosis Date   Allergy history unknown    Arthritis    Asthma    Carpal tunnel syndrome    Fatty liver    GERD (gastroesophageal reflux disease)    Herpes zoster    HLD (hyperlipidemia)    mixed   HTN (hypertension)    Hypercalcemia    Hyperparathyroidism    secondary   Insomnia    Osteopenia    Rosacea    Vitamin B deficiency    Vitamin D deficiency    Past Surgical History:  Procedure Laterality Date   CARDIOVASCULAR STRESS TEST     nml    CESAREAN SECTION     x3   Patient Active Problem List   Diagnosis Date Noted   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   HYPERLIPIDEMIA-MIXED 01/10/2009   ESSENTIAL HYPERTENSION, BENIGN 01/10/2009    ONSET DATE: Dec 24  REFERRING DIAG: bilateral 3rd digits trigger fingers   THERAPY DIAG:  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 She cannot tolerate  steroid injections due to adverse reactions. Hand therapy and paraffin baths are considered as alternative treatments. Surgery remains an option if conservative measures fail, with minimal risk for complications. She will be referred to hand therapy at [facility] in [place] with therapist Deland. Paraffin bath therapy is considered to increase blood flow and reduce pain. Reassessment is planned in one month to evaluate the effectiveness of hand therapy. If symptoms persist, surgical intervention to release the A1 pulley will be discussed.  Dupuytren's contracture  A fibrous band under the skin suggests Dupuytren's contracture, in addition to trigger finger.  Diabetes mellitus without complications  Diabetes is well-controlled with an A1c of 5.5. Blood sugar levels are managed through diet without medication. Diagnoses and all orders for this visit:  Trigger middle finger of left hand - Ambulatory Referral to Occupational Therapy  Trigger middle finger of right hand - Ambulatory Referral to Occupational Therapy  Controlled type 2 diabetes mellitus with stage 3 chronic kidney disease, with long-term current use of insulin (CMS/HHS-HCC)   PRECAUTIONS: None    WEIGHT BEARING RESTRICTIONS: No  PAIN:  Are you having pain?  Pain 4/10 on R 3rd A1pulley and L 1/10   FALLS: Has patient fallen in last 6 months? No  LIVING ENVIRONMENT: Lives  with: lives alone   PLOF: Prior to December no triggering.  Stiffness in the fingers.  Patient independent doing housework and cooking and laundry.  Takes care of her 57-year-old great grand daughter in the afternoons 1-5.  Drive  PATIENT GOALS: I want to get the trigger fingers better and the locking and the pain so I do not need surgery.  NEXT MD VISIT: 4 to 6 weeks  OBJECTIVE:  Note: Objective measures were completed at Evaluation unless otherwise noted.  HAND DOMINANCE: Right  ADLs: Patient limited with increased  triggering as well as pain in gripping activities, turning a doorknob holding objects pulling pushing.  FUNCTIONAL OUTCOME MEASURES: Neck session  UPPER EXTREMITY ROM:     Active ROM Right eval Left eval  Shoulder flexion    Shoulder abduction    Shoulder adduction    Shoulder extension    Shoulder internal rotation    Shoulder external rotation    Elbow flexion    Elbow extension    Wrist flexion 62 72  Wrist extension    Wrist ulnar deviation    Wrist radial deviation    Wrist pronation    Wrist supination    (Blank rows = not tested)  Active ROM Right eval Left eval  Thumb MCP (0-60)    Thumb IP (0-80)    Thumb Radial abd/add (0-55)     Thumb Palmar abd/add (0-45)     Thumb Opposition to Small Finger     Index MCP (0-90) 85 80   Index PIP (0-100) 90  95  Index DIP (0-70)      Long MCP (0-90) 75  85   Long PIP (0-100) 90  ext -15  95 et -10  Long DIP (0-70)      Ring MCP (0-90)  85  90  Ring PIP (0-100)  90  95  Ring DIP (0-70)      Little MCP (0-90)  90 90   Little PIP (0-100)  85  95  Little DIP (0-70)      (Blank rows = not tested)   HAND FUNCTION: Grip strength: Right: 30 lbs; Left: 30 lbs, Lateral pinch: Right: 10 lbs, Left: 13 lbs, and 3 point pinch: Right: 9 lbs, Left: 6 lbs  COORDINATION: Limited because of triggering when attempting to bend or flex third digit SENSATION: Denies any sensory issues  EDEMA: A1 pulleys tender in the large  COGNITION: Overall cognitive status: Within functional limits for tasks assessed     TREATMENT DATE: 09/12/23      Pt reports she has an allergy to cortisone (not listed in her allergy list) and thinks she may have had a reaction to the iontophoresis with dexamethasone from her last treatment session.  She reported an aching feeling in bilateral arms and felt really off balance which are similar symptoms in the past with cortisone.  Will discontinue ionto at this time.  Modalities: Contrast bath:  Time:  8 Location: bilateral hands to decrease pain, stiffness and increase tissue mobility Pain and inflammation and decrease stiffness prior to review of home exercises  Patient with custom MC block splints for bilateral third digits, she continues to wear at nighttime as well as during the day with to prevent composite flexion or fisting activities.  Manual Therapy: Following contrast, pt was seen for manual therapy techniques performed by therapist for soft tissue mobilization and massage, use of foam roller over the volar surface of the hands and digits in addition to forearm  Use of Graston tool #2 performed over palm and for brushing and sweeping over the volar digits.   Therapeutic Exercises:  Following manual therapy, pt seen for therapeutic exercises to improve ROM and strength of bilateral hands. Prayer stretch for composite extension Blocked MC flexion with PIP extension 10 reps AROM Intrinsic 10 reps passive range of motion pain free  Opposition to all digits 10 reps  Patient requires min verbal and tactile cues for proper form and technique.  Education regarding modifications to use palm or larger objects to carry or hold objects, enlarging her grips and handles. Using foam roller to enlarge handles, picking up her pocketbook, managing Grocery cart and driving.   PATIENT EDUCATION: Education details: findings of eval and HEP  Person educated: Patient Education method: Explanation, Demonstration, Tactile cues, Verbal cues, and Handouts Education comprehension: verbalized understanding, returned demonstration, verbal cues required, and needs further education    GOALS: Goals reviewed with patient? Yes  LONG TERM GOALS: Target date: 6 wks  Patient be independent home program to increase motion and decrease pain to less than 3/10. Baseline: Patient no knowledge of home program.  Pain and tenderness 8-9/10.  Triggering several times in the clinic and reported during the  day. Goal status: INITIAL  2.  Patient's passive range of motion to bilateral third digits improved to touching palm with no increased pain or triggering. Baseline: Third digit right hand MC 75 and PIP 90.  Left third MCP 85 and PIP 95.  But triggered sensation.  Tenderness and pain 8-9/10.  Patient also showing decreased PIP extension in bilateral thirds -10 to -15 degrees Goal status: INITIAL  3.  Patient verbalize 2-3 modifications and adaptations to decrease triggering as well as pain in bilateral hands. Baseline: Patient no knowledge.  Pain is 8-9/10 over bilateral third digits as well as A1 pulley.  Triggering several times during the day as well as in session.   Goal status: INITIAL  4.  Patient's show composite flexion touching palm to use in ADLs and IADLs with less than 2 episodes of triggering a day Baseline: Patient with several times during the day or every time attempting to do composite flexion triggering at bilateral third digits.  Pain 8-9/10 pain.  Decreased range of motion in bilateral digits Goal status: INITIAL  5.  Grip and prevention strength improved to within normal limits for her age with no increased pain and symptoms to return to prior level of function Baseline: Grip 30 pounds left and right hand lateral pinch on the right 10 pounds left 13 pounds and 3 point pinch 9 and left 6 pounds Goal status: INITIAL    ASSESSMENT:  CLINICAL IMPRESSION: Patient seen today for occupational therapy for bilateral third digit trigger fingers.  Patient reports started in December 24.  Patient not able to get any shots because of allergic reactions and diabetic.  Patient pain 8-9/10 over bilateral third digits as well as A1 pulleys.  Patient showing in session several episodes of triggering.  Also report every attempt of composite fist third digits triggering or locking.  With worse in the morning.  Patient with decreased flexion in all digits as well as decreased PIP extension in  bilateral third digits with -10 to -15 degrees.  Grip and prehension strength decreased in bilateral hands. NOW patient arrived decreased pain to a 4/10 on the right and 1-2/10 on the left. - Passive range of motion improving in bilateral DIP of PIP flexion as well as composite- reviewed with  patient again passive range of motion pain-free-intrinsic a fist.  And making sure she goes into extension in between.  Reinforced no active range of motion. .3rd session of iontophoresis today with dexamethasone.  Patient tolerated well on the left.  Right appear pt  had lotion on.  Limiting patient's functional use and ADLs and IADLs.  Patient can benefit from skilled OT services to decrease pain and inflammation and increase motion and strength as well as joint protection and modification education, splinting and modalities to return to prior level of function.  PERFORMANCE DEFICITS: in functional skills including ADLs, IADLs, ROM, strength, pain, flexibility, decreased knowledge of use of DME, and UE functional use,   and psychosocial skills including environmental adaptation and routines and behaviors.   IMPAIRMENTS: are limiting patient from ADLs, IADLs, rest and sleep, play, leisure, and social participation.   COMORBIDITIES: has no other co-morbidities that affects occupational performance. Patient will benefit from skilled OT to address above impairments and improve overall function.  MODIFICATION OR ASSISTANCE TO COMPLETE EVALUATION: No modification of tasks or assist necessary to complete an evaluation.  OT OCCUPATIONAL PROFILE AND HISTORY: Problem focused assessment: Including review of records relating to presenting problem.  CLINICAL DECISION MAKING: LOW - limited treatment options, no task modification necessary  REHAB POTENTIAL: Good for goals  EVALUATION COMPLEXITY: Low      PLAN:  OT FREQUENCY: 1-2x/week  OT DURATION: 6 weeks  PLANNED INTERVENTIONS: 97168 OT Re-evaluation, 97535  self care/ADL training, 02889 therapeutic exercise, 97530 therapeutic activity, 97140 manual therapy, 97035 ultrasound, 97018 paraffin, 02960 fluidotherapy, 97034 contrast bath, 97033 iontophoresis, orthotics, joint protection and modifications     CONSULTED AND AGREED WITH PLAN OF CARE: Patient     Biagio Snelson, OTR/L,CLT 09/18/2023, 10:09 AM

## 2023-09-13 NOTE — Therapy (Signed)
 OUTPATIENT OCCUPATIONAL THERAPY ORTHO TREATMENT  Patient Name: Kathryn Owen MRN: [id number] DOB:[birth year], 78 y.o., female Today's Date: 09/19/2023  PCP: Dr Ross MART PROVIDER: Dr Kathlynn  END OF SESSION:  OT End of Session - 09/18/23 1009     Visit Number 6    Number of Visits 12    Date for OT Re-Evaluation 10/02/23    OT Start Time 0815    OT Stop Time 0905    OT Time Calculation (min) 50 min    Activity Tolerance Patient tolerated treatment well    Behavior During Therapy WFL for tasks assessed/performed           Past Medical History:  Diagnosis Date   Allergy history unknown    Arthritis    Asthma    Carpal tunnel syndrome    Fatty liver    GERD (gastroesophageal reflux disease)    Herpes zoster    HLD (hyperlipidemia)    mixed   HTN (hypertension)    Hypercalcemia    Hyperparathyroidism    secondary   Insomnia    Osteopenia    Rosacea    Vitamin B deficiency    Vitamin D deficiency    Past Surgical History:  Procedure Laterality Date   CARDIOVASCULAR STRESS TEST     nml    CESAREAN SECTION     x3   Patient Active Problem List   Diagnosis Date Noted   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   HYPERLIPIDEMIA-MIXED 01/10/2009   ESSENTIAL HYPERTENSION, BENIGN 01/10/2009    ONSET DATE: Dec 24  REFERRING DIAG: bilateral 3rd digits trigger fingers   THERAPY DIAG:  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 She cannot tolerate  steroid injections due to adverse reactions. Hand therapy and paraffin baths are considered as alternative treatments. Surgery remains an option if conservative measures fail, with minimal risk for complications. She will be referred to hand therapy at [facility] in [place] with therapist Deland. Paraffin bath therapy is considered to increase blood flow and reduce pain. Reassessment is planned in one month to evaluate the effectiveness of hand therapy. If symptoms persist, surgical intervention to release the A1 pulley will be discussed.  Dupuytren's contracture  A fibrous band under the skin suggests Dupuytren's contracture, in addition to trigger finger.  Diabetes mellitus without complications  Diabetes is well-controlled with an A1c of 5.5. Blood sugar levels are managed through diet without medication. Diagnoses and all orders for this visit:  Trigger middle finger of left hand - Ambulatory Referral to Occupational Therapy  Trigger middle finger of right hand - Ambulatory Referral to Occupational Therapy  Controlled type 2 diabetes mellitus with stage 3 chronic kidney disease, with long-term current use of insulin (CMS/HHS-HCC)   PRECAUTIONS: None    WEIGHT BEARING RESTRICTIONS: No  PAIN:  Are you having pain?  Pain 4/10 on R 3rd A1pulley and L 1/10   FALLS: Has patient fallen in last 6 months? No  LIVING ENVIRONMENT: Lives  with: lives alone   PLOF: Prior to December no triggering.  Stiffness in the fingers.  Patient independent doing housework and cooking and laundry.  Takes care of her 20-year-old great grand daughter in the afternoons 1-5.  Drive  PATIENT GOALS: I want to get the trigger fingers better and the locking and the pain so I do not need surgery.  NEXT MD VISIT: 4 to 6 weeks  OBJECTIVE:  Note: Objective measures were completed at Evaluation unless otherwise noted.  HAND DOMINANCE: Right  ADLs: Patient limited with increased  triggering as well as pain in gripping activities, turning a doorknob holding objects pulling pushing.  FUNCTIONAL OUTCOME MEASURES: Neck session  UPPER EXTREMITY ROM:     Active ROM Right eval Left eval  Shoulder flexion    Shoulder abduction    Shoulder adduction    Shoulder extension    Shoulder internal rotation    Shoulder external rotation    Elbow flexion    Elbow extension    Wrist flexion 62 72  Wrist extension    Wrist ulnar deviation    Wrist radial deviation    Wrist pronation    Wrist supination    (Blank rows = not tested)  Active ROM Right eval Left eval  Thumb MCP (0-60)    Thumb IP (0-80)    Thumb Radial abd/add (0-55)     Thumb Palmar abd/add (0-45)     Thumb Opposition to Small Finger     Index MCP (0-90) 85 80   Index PIP (0-100) 90  95  Index DIP (0-70)      Long MCP (0-90) 75  85   Long PIP (0-100) 90  ext -15  95 et -10  Long DIP (0-70)      Ring MCP (0-90)  85  90  Ring PIP (0-100)  90  95  Ring DIP (0-70)      Little MCP (0-90)  90 90   Little PIP (0-100)  85  95  Little DIP (0-70)      (Blank rows = not tested)   HAND FUNCTION: Grip strength: Right: 30 lbs; Left: 30 lbs, Lateral pinch: Right: 10 lbs, Left: 13 lbs, and 3 point pinch: Right: 9 lbs, Left: 6 lbs  COORDINATION: Limited because of triggering when attempting to bend or flex third digit SENSATION: Denies any sensory issues  EDEMA: A1 pulleys tender in the large  COGNITION: Overall cognitive status: Within functional limits for tasks assessed     TREATMENT DATE: 09/12/23      Pt reports she has an allergy to cortisone (not listed in her allergy list) and thinks she may have had a reaction to the iontophoresis with dexamethasone from her last treatment session.  She reported an aching feeling in bilateral arms and felt really off balance which are similar symptoms in the past with cortisone.  Will discontinue ionto at this time.  Modalities: Contrast bath:  Time:  8 Location: bilateral hands to decrease pain, stiffness and increase tissue mobility Pain and inflammation and decrease stiffness prior to review of home exercises  Patient with custom MC block splints for bilateral third digits, she continues to wear at nighttime as well as during the day with to prevent composite flexion or fisting activities.  Manual Therapy: Following contrast, pt was seen for manual therapy techniques performed by therapist for soft tissue mobilization and massage, use of foam roller over the volar surface of the hands and digits in addition to forearm  Use of Graston tool #2 performed over palm and for brushing and sweeping over the volar digits.   Therapeutic Exercises:  Following manual therapy, pt seen for therapeutic exercises to improve ROM and strength of bilateral hands. Prayer stretch for composite extension Blocked MC flexion with PIP extension 10 reps AROM Intrinsic 10 reps passive range of motion pain free  Opposition to all digits 10 reps  Patient requires min verbal and tactile cues for proper form and technique.  Education regarding modifications to use palm or larger objects to carry or hold objects, enlarging her grips and handles. Using foam roller to enlarge handles, picking up her pocketbook, managing Grocery cart and driving.   PATIENT EDUCATION: Education details: findings of eval and HEP  Person educated: Patient Education method: Explanation, Demonstration, Tactile cues, Verbal cues, and Handouts Education comprehension: verbalized understanding, returned demonstration, verbal cues required, and needs further education    GOALS: Goals reviewed with patient? Yes  LONG TERM GOALS: Target date: 6 wks  Patient be independent home program to increase motion and decrease pain to less than 3/10. Baseline: Patient no knowledge of home program.  Pain and tenderness 8-9/10.  Triggering several times in the clinic and reported during the  day. Goal status: INITIAL  2.  Patient's passive range of motion to bilateral third digits improved to touching palm with no increased pain or triggering. Baseline: Third digit right hand MC 75 and PIP 90.  Left third MCP 85 and PIP 95.  But triggered sensation.  Tenderness and pain 8-9/10.  Patient also showing decreased PIP extension in bilateral thirds -10 to -15 degrees Goal status: INITIAL  3.  Patient verbalize 2-3 modifications and adaptations to decrease triggering as well as pain in bilateral hands. Baseline: Patient no knowledge.  Pain is 8-9/10 over bilateral third digits as well as A1 pulley.  Triggering several times during the day as well as in session.   Goal status: INITIAL  4.  Patient's show composite flexion touching palm to use in ADLs and IADLs with less than 2 episodes of triggering a day Baseline: Patient with several times during the day or every time attempting to do composite flexion triggering at bilateral third digits.  Pain 8-9/10 pain.  Decreased range of motion in bilateral digits Goal status: INITIAL  5.  Grip and prevention strength improved to within normal limits for her age with no increased pain and symptoms to return to prior level of function Baseline: Grip 30 pounds left and right hand lateral pinch on the right 10 pounds left 13 pounds and 3 point pinch 9 and left 6 pounds Goal status: INITIAL    ASSESSMENT:  CLINICAL IMPRESSION: Patient seen today for occupational therapy for bilateral third digit trigger fingers.  Patient reports started in December 24.  Patient not able to get any shots because of allergic reactions and diabetic.  Patient pain 8-9/10 over bilateral third digits as well as A1 pulleys.  Patient showing in session several episodes of triggering.  Also report every attempt of composite fist third digits triggering or locking.  With worse in the morning.  Patient with decreased flexion in all digits as well as decreased PIP extension in  bilateral third digits with -10 to -15 degrees.  Grip and prehension strength decreased in bilateral hands. Now: Pt reports a potential reaction from iontophoresis and an allergy to cortisone and steroids which was not listed on her allergy list. Pt reported bilateral arm pain and aching for a few days  and feeling off balance which are similar symptoms in the past after use of steroids for anti inflammatory.  Discontinued iontophoresis and pt will discuss with MD to add cortisone and steroid to her allergy list.  Pt reports she is feeling much better after a few days with symptoms improving from reaction but also her hands are feeling better with less pain especially after treatment this date.   Passive range of motion improving in bilateral DIP of PIP flexion as well as composite.  Reinforced no active range of motion. Limiting patient's functional use and ADLs and IADLs.  Patient can benefit from skilled OT services to decrease pain and inflammation and increase motion and strength as well as joint protection and modification education, splinting and modalities to return to prior level of function.  PERFORMANCE DEFICITS: in functional skills including ADLs, IADLs, ROM, strength, pain, flexibility, decreased knowledge of use of DME, and UE functional use,   and psychosocial skills including environmental adaptation and routines and behaviors.   IMPAIRMENTS: are limiting patient from ADLs, IADLs, rest and sleep, play, leisure, and social participation.   COMORBIDITIES: has no other co-morbidities that affects occupational performance. Patient will benefit from skilled OT to address above impairments and improve overall function.  MODIFICATION OR ASSISTANCE TO COMPLETE EVALUATION: No modification of tasks or assist necessary to complete an evaluation.  OT OCCUPATIONAL PROFILE AND HISTORY: Problem focused assessment: Including review of records relating to presenting problem.  CLINICAL DECISION MAKING:  LOW - limited treatment options, no task modification necessary  REHAB POTENTIAL: Good for goals  EVALUATION COMPLEXITY: Low   PLAN:  OT FREQUENCY: 1-2x/week  OT DURATION: 6 weeks  PLANNED INTERVENTIONS: 97168 OT Re-evaluation, 97535 self care/ADL training, 02889 therapeutic exercise, 97530 therapeutic activity, 97140 manual therapy, 97035 ultrasound, 97018 paraffin, 02960 fluidotherapy, 97034 contrast bath, 97033 iontophoresis, orthotics, joint protection and modifications   CONSULTED AND AGREED WITH PLAN OF CARE: Patient  Weronika Birch, OTR/L,CLT 09/19/2023, 7:17 AM

## 2023-09-15 ENCOUNTER — Ambulatory Visit: Admitting: Occupational Therapy

## 2023-09-15 DIAGNOSIS — M65331 Trigger finger, right middle finger: Secondary | ICD-10-CM

## 2023-09-15 DIAGNOSIS — M79642 Pain in left hand: Secondary | ICD-10-CM

## 2023-09-15 DIAGNOSIS — M79641 Pain in right hand: Secondary | ICD-10-CM

## 2023-09-15 DIAGNOSIS — M25641 Stiffness of right hand, not elsewhere classified: Secondary | ICD-10-CM

## 2023-09-19 ENCOUNTER — Encounter: Payer: Self-pay | Admitting: Occupational Therapy

## 2023-09-19 ENCOUNTER — Ambulatory Visit: Admitting: Occupational Therapy

## 2023-09-19 DIAGNOSIS — M65331 Trigger finger, right middle finger: Secondary | ICD-10-CM | POA: Diagnosis not present

## 2023-09-19 DIAGNOSIS — M79641 Pain in right hand: Secondary | ICD-10-CM

## 2023-09-19 DIAGNOSIS — M25641 Stiffness of right hand, not elsewhere classified: Secondary | ICD-10-CM

## 2023-09-19 DIAGNOSIS — M79642 Pain in left hand: Secondary | ICD-10-CM

## 2023-09-19 NOTE — Therapy (Signed)
 OUTPATIENT OCCUPATIONAL THERAPY ORTHO TREATMENT  Patient Name: Kathryn Owen MRN: [id number] DOB:1945/04/18, 78 y.o., female Today's Date: 09/19/2023  PCP: Dr Ross MART PROVIDER: Dr Kathlynn  END OF SESSION:  OT End of Session - 09/19/23 0818     Visit Number 8    Number of Visits 12    Date for OT Re-Evaluation 10/02/23    OT Start Time 0817    OT Stop Time 0905    OT Time Calculation (min) 48 min    Activity Tolerance Patient tolerated treatment well    Behavior During Therapy WFL for tasks assessed/performed           Past Medical History:  Diagnosis Date   Allergy history unknown    Arthritis    Asthma    Carpal tunnel syndrome    Fatty liver    GERD (gastroesophageal reflux disease)    Herpes zoster    HLD (hyperlipidemia)    mixed   HTN (hypertension)    Hypercalcemia    Hyperparathyroidism    secondary   Insomnia    Osteopenia    Rosacea    Vitamin B deficiency    Vitamin D deficiency    Past Surgical History:  Procedure Laterality Date   CARDIOVASCULAR STRESS TEST     nml    CESAREAN SECTION     x3   Patient Active Problem List   Diagnosis Date Noted   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  She reports she will tell her PCP to put steroid and cortisone on her allergy list.  Left hand improving really good.  Right third digit still triggering but less. Pt accompanied by: self  PERTINENT HISTORY: Assessment/Plan: 07/08/23 Dr Kathlynn note  Assessment  & Plan Trigger finger  Bilateral trigger finger affects both hands, causing locking of the middle fingers daily, especially in the morning, for over five months. She cannot tolerate steroid injections due to adverse reactions. Hand therapy and paraffin baths are considered as alternative treatments. Surgery remains an option if conservative measures fail, with minimal risk for complications. She will be referred to hand therapy at [facility] in [place] with therapist Deland. Paraffin bath therapy is considered to increase blood flow and reduce pain. Reassessment is planned in one month to evaluate the effectiveness of hand therapy. If symptoms persist, surgical intervention to release the A1 pulley will be discussed.  Dupuytren's contracture  A fibrous band under the skin suggests Dupuytren's contracture, in addition to trigger finger.  Diabetes mellitus without complications  Diabetes is well-controlled with an A1c of 5.5. Blood sugar levels are managed through diet without medication. Diagnoses and all orders for this visit:  Trigger middle finger of left hand - Ambulatory Referral to Occupational Therapy  Trigger middle finger of right hand - Ambulatory Referral to Occupational Therapy  Controlled type 2 diabetes mellitus with stage 3 chronic kidney disease, with long-term current use of insulin (CMS/HHS-HCC)   PRECAUTIONS: None    WEIGHT BEARING  RESTRICTIONS: No  PAIN:  Are you having pain?  Pain 4/10 on R 3rd A1pulley and L 1/10   FALLS: Has patient fallen in last 6 months? No  LIVING ENVIRONMENT: Lives with: lives alone   PLOF: Prior to December no triggering.  Stiffness in the fingers.  Patient independent doing housework and cooking and laundry.  Takes care of her 57-year-old great grand daughter in the afternoons 1-5.  Drive  PATIENT GOALS: I want to get the trigger fingers better and the locking and the pain so I do not need surgery.  NEXT MD  VISIT: 4 to 6 weeks  OBJECTIVE:  Note: Objective measures were completed at Evaluation unless otherwise noted.  HAND DOMINANCE: Right  ADLs: Patient limited with increased triggering as well as pain in gripping activities, turning a doorknob holding objects pulling pushing.  FUNCTIONAL OUTCOME MEASURES: Neck session  UPPER EXTREMITY ROM:     Active ROM Right eval Left eval  Shoulder flexion    Shoulder abduction    Shoulder adduction    Shoulder extension    Shoulder internal rotation    Shoulder external rotation    Elbow flexion    Elbow extension    Wrist flexion 62 72  Wrist extension    Wrist ulnar deviation    Wrist radial deviation    Wrist pronation    Wrist supination    (Blank rows = not tested)  Active ROM Right eval Left eval R/L 09/19/23 PROM  Thumb MCP (0-60)     Thumb IP (0-80)     Thumb Radial abd/add (0-55)      Thumb Palmar abd/add (0-45)      Thumb Opposition to Small Finger      Index MCP (0-90) 85 80    Index PIP (0-100) 90  95   Index DIP (0-70)       Long MCP (0-90) 75  85  90/90  Long PIP (0-100) 90  ext -15  95 et -10  -10 ext coming in 95/L100  Long DIP (0-70)     60/70  Ring MCP (0-90)  85  90   Ring PIP (0-100)  90  95   Ring DIP (0-70)       Little MCP (0-90)  90 90    Little PIP (0-100)  85  95   Little DIP (0-70)       (Blank rows = not tested)   HAND FUNCTION: Grip strength: Right: 30 lbs; Left: 30 lbs, Lateral pinch: Right: 10 lbs, Left: 13 lbs, and 3 point pinch: Right: 9 lbs, Left: 6 lbs 09/19/23 Grip strength: Right: 30 lbs; Left: 38 lbs, Lateral pinch: Right: 11 lbs, Left: 13 lbs, and 3 point pinch: Right: 11 lbs, Left: 8 lbs COORDINATION: Limited because of triggering when attempting to bend or flex third digit SENSATION: Denies any sensory issues  EDEMA: A1 pulleys tender in the large  COGNITION: Overall cognitive status: Within functional limits for tasks assessed     TREATMENT DATE: 09/18/23     Assess  patient's passive range of motion.  Improving on the left more than the right. Third MCP flexion 90 degrees.  Right PIP 95 and left 100. Tenderness more in the right than the left but improved from eval Range of motion improving. Grip and prehension improving.  See flowsheet    Paraffin: Time: 8 Location: Left hand to decrease  stiffness and increase tissue mobility Pain decrease stiffness prior to review of home exercises  Right hand contrast because of continued increase tenderness over the A1 pulley inflammation over the volar plates and the metacarpals. Patient to continue to do 2-3 times a day on bilateral hands prior to soft tissue and passive range of motion.  Manual Therapy: Following modalities, pt was seen for manual therapy techniques performed by therapist for soft tissue mobilization and massage use of foam roller over the volar surface of the hands and digits in addition to forearm at home Use of Graston tool #6 performed over palm and for brushing and sweeping over the volar digits to decrease pain, stiffness and promote increased ROM-focusing mostly on third digits. Patient with full PIP extension on bilateral hands on third digits after soft tissue. Patient to focus at home same.  Therapeutic Exercises:  Following manual therapy, pt seen for therapeutic exercises to improve ROM and strength of bilateral hands. Prayer stretch for composite extension Blocked MC flexion with PIP extension 10 reps AROM Intrinsic 10 reps passive range of motion pain free -verbal cueing to focus on correct technique keeping MC extended.  As well as going to extension in between each tendon glides. At composite flexion passive range of motion this date to but reinforced to stop prior to pull or pain.  5-8 reps  opposition to all digits 10 reps  Patient requires min verbal and tactile cues for proper form and technique but requiring -moderate assistance with changes in home program as well as  doing technique correctly  Ultrasound at 20% at 3.3 MHz 1.0 intensity for 3 minutes done on right third A1 pulley to decrease inflammation and pain Patient was fitted with a Isotoner glove to use at nighttime on the right hand to decrease inflammation over the metacarpals. Tolerated well with no issues with ultrasound  Continued discussions regarding the importance of modifications to use palm or larger objects to carry or hold objects, enlarging her grips and handles. Using foam roller to enlarge handles, picking up her pocketbook, managing Grocery cart and driving.   PATIENT EDUCATION: Education details: findings of eval and HEP  Person educated: Patient Education method: Explanation, Demonstration, Tactile cues, Verbal cues, and Handouts Education comprehension: verbalized understanding, returned demonstration, verbal cues required, and needs further education    GOALS: Goals reviewed with patient? Yes  LONG TERM GOALS: Target date: 6 wks  Patient be independent home program to increase motion and decrease pain to less than 3/10. Baseline: Patient no knowledge of home program.  Pain and tenderness 8-9/10.  Triggering several times in the clinic and reported during the day. Goal status: INITIAL  2.  Patient's passive range of motion to bilateral third digits improved to touching palm with no increased pain or triggering. Baseline: Third digit right hand MC 75 and PIP 90.  Left third MCP 85 and PIP 95.  But triggered sensation.  Tenderness and pain 8-9/10.  Patient also showing decreased PIP extension in bilateral thirds -10 to -15 degrees Goal status: INITIAL  3.  Patient verbalize 2-3 modifications and adaptations to decrease triggering as well as pain in bilateral hands. Baseline: Patient no knowledge.  Pain is 8-9/10 over bilateral third digits as well as A1 pulley.  Triggering several times during the day as well as in session.   Goal status: INITIAL  4.  Patient's show  composite flexion touching palm to use in ADLs and IADLs with less than 2 episodes of triggering a day Baseline: Patient with several times during the day or every time attempting to do composite  flexion triggering at bilateral third digits.  Pain 8-9/10 pain.  Decreased range of motion in bilateral digits Goal status: INITIAL  5.  Grip and prevention strength improved to within normal limits for her age with no increased pain and symptoms to return to prior level of function Baseline: Grip 30 pounds left and right hand lateral pinch on the right 10 pounds left 13 pounds and 3 point pinch 9 and left 6 pounds Goal status: INITIAL    ASSESSMENT:  CLINICAL IMPRESSION: Patient seen today for occupational therapy for bilateral third digit trigger fingers.  Patient reports started in December 24.  Patient not able to get any shots because of allergic reactions and diabetic.  Patient pain 8-9/10 over bilateral third digits as well as A1 pulleys.  Patient showing in session several episodes of triggering.  Also report every attempt of composite fist third digits triggering or locking.  With worse in the morning.  Patient with decreased flexion in all digits as well as decreased PIP extension in bilateral third digits with -10 to -15 degrees.  Grip and prehension strength decreased in bilateral hands NOW . Pt reported a potential reaction from iontophoresis 2 wks ago and an allergy to cortisone and steroids which was not listed on her allergy list. Pt reported bilateral arm pain and aching for a few days and feeling off balance which are similar symptoms in the past after use of steroids for anti inflammatory.  Discontinued iontophoresis last week - pt will discuss with MD to add cortisone and steroid to her allergy list.  Pt reports reactions have now resolved and she is feeling much better, no pain in upper arms and balance feels restored.  Patient is left-hand improving greatly.  Tenderness of the A1 pulley  1/10 and on the right 4/10.  Passive range of motion improving in bilateral third digits.  Grip and prehension strength improved.  See flowsheet.  Was able to do paraffin on the left hand but patient to continue with contrast on the right because of continued inflammation patient was fitted with a Isotoner glove this date to wear at nighttime.  Focusing on pain-free passive range of motion after modalities to decrease inflammation.  Patient can benefit from skilled OT services to decrease pain and inflammation and increase motion and strength as well as joint protection and modification education, splinting and modalities to return to prior level of function.  PERFORMANCE DEFICITS: in functional skills including ADLs, IADLs, ROM, strength, pain, flexibility, decreased knowledge of use of DME, and UE functional use,   and psychosocial skills including environmental adaptation and routines and behaviors.   IMPAIRMENTS: are limiting patient from ADLs, IADLs, rest and sleep, play, leisure, and social participation.   COMORBIDITIES: has no other co-morbidities that affects occupational performance. Patient will benefit from skilled OT to address above impairments and improve overall function.  MODIFICATION OR ASSISTANCE TO COMPLETE EVALUATION: No modification of tasks or assist necessary to complete an evaluation.  OT OCCUPATIONAL PROFILE AND HISTORY: Problem focused assessment: Including review of records relating to presenting problem.  CLINICAL DECISION MAKING: LOW - limited treatment options, no task modification necessary  REHAB POTENTIAL: Good for goals  EVALUATION COMPLEXITY: Low   PLAN:  OT FREQUENCY: 1-2x/week  OT DURATION: 6 weeks  PLANNED INTERVENTIONS: 97168 OT Re-evaluation, 97535 self care/ADL training, 02889 therapeutic exercise, 97530 therapeutic activity, 97140 manual therapy, 97035 ultrasound, 97018 paraffin, 02960 fluidotherapy, 97034 contrast bath, 97033 iontophoresis,  orthotics, joint protection and modifications   CONSULTED AND  AGREED WITH PLAN OF CARE: Patient  Ancel Peters, OTR/L,CLT 09/19/2023, 10:21 AM

## 2023-09-19 NOTE — Therapy (Signed)
 OUTPATIENT OCCUPATIONAL THERAPY ORTHO TREATMENT  Patient Name: Kathryn Owen MRN: [id number] DOB:[birth year], 78 y.o., female Today's Date: 09/19/2023  PCP: Dr Ross MART PROVIDER: Dr Kathlynn  END OF SESSION:  OT End of Session - 09/19/23 0731     Visit Number 7    Number of Visits 12    Date for OT Re-Evaluation 10/02/23    OT Start Time 0858    OT Stop Time 0945    OT Time Calculation (min) 47 min    Activity Tolerance Patient tolerated treatment well    Behavior During Therapy WFL for tasks assessed/performed           Past Medical History:  Diagnosis Date   Allergy history unknown    Arthritis    Asthma    Carpal tunnel syndrome    Fatty liver    GERD (gastroesophageal reflux disease)    Herpes zoster    HLD (hyperlipidemia)    mixed   HTN (hypertension)    Hypercalcemia    Hyperparathyroidism    secondary   Insomnia    Osteopenia    Rosacea    Vitamin B deficiency    Vitamin D deficiency    Past Surgical History:  Procedure Laterality Date   CARDIOVASCULAR STRESS TEST     nml    CESAREAN SECTION     x3   Patient Active Problem List   Diagnosis Date Noted   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   HYPERLIPIDEMIA-MIXED 01/10/2009   ESSENTIAL HYPERTENSION, BENIGN 01/10/2009    ONSET DATE: Dec 24  REFERRING DIAG: bilateral 3rd digits trigger fingers   THERAPY DIAG:  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 She cannot tolerate steroid injections due to adverse  reactions. Hand therapy and paraffin baths are considered as alternative treatments. Surgery remains an option if conservative measures fail, with minimal risk for complications. She will be referred to hand therapy at [facility] in [place] with therapist Deland. Paraffin bath therapy is considered to increase blood flow and reduce pain. Reassessment is planned in one month to evaluate the effectiveness of hand therapy. If symptoms persist, surgical intervention to release the A1 pulley will be discussed.  Dupuytren's contracture  A fibrous band under the skin suggests Dupuytren's contracture, in addition to trigger finger.  Diabetes mellitus without complications  Diabetes is well-controlled with an A1c of 5.5. Blood sugar levels are managed through diet without medication. Diagnoses and all orders for this visit:  Trigger middle finger of left hand - Ambulatory Referral to Occupational Therapy  Trigger middle finger of right hand - Ambulatory Referral to Occupational Therapy  Controlled type 2 diabetes mellitus with stage 3 chronic kidney disease, with long-term current use of insulin (CMS/HHS-HCC)   PRECAUTIONS: None    WEIGHT BEARING RESTRICTIONS: No  PAIN:  Are you having pain?  Pain 4/10 on R 3rd A1pulley and L 1/10   FALLS: Has patient fallen in last 6 months? No  LIVING ENVIRONMENT: Lives with: lives alone   PLOF: Prior to December no  triggering.  Stiffness in the fingers.  Patient independent doing housework and cooking and laundry.  Takes care of her 69-year-old great grand daughter in the afternoons 1-5.  Drive  PATIENT GOALS: I want to get the trigger fingers better and the locking and the pain so I do not need surgery.  NEXT MD VISIT: 4 to 6 weeks  OBJECTIVE:  Note: Objective measures were completed at Evaluation unless otherwise noted.  HAND DOMINANCE: Right  ADLs: Patient limited with increased triggering as well as pain in gripping  activities, turning a doorknob holding objects pulling pushing.  FUNCTIONAL OUTCOME MEASURES: Neck session  UPPER EXTREMITY ROM:     Active ROM Right eval Left eval  Shoulder flexion    Shoulder abduction    Shoulder adduction    Shoulder extension    Shoulder internal rotation    Shoulder external rotation    Elbow flexion    Elbow extension    Wrist flexion 62 72  Wrist extension    Wrist ulnar deviation    Wrist radial deviation    Wrist pronation    Wrist supination    (Blank rows = not tested)  Active ROM Right eval Left eval  Thumb MCP (0-60)    Thumb IP (0-80)    Thumb Radial abd/add (0-55)     Thumb Palmar abd/add (0-45)     Thumb Opposition to Small Finger     Index MCP (0-90) 85 80   Index PIP (0-100) 90  95  Index DIP (0-70)      Long MCP (0-90) 75  85   Long PIP (0-100) 90  ext -15  95 et -10  Long DIP (0-70)      Ring MCP (0-90)  85  90  Ring PIP (0-100)  90  95  Ring DIP (0-70)      Little MCP (0-90)  90 90   Little PIP (0-100)  85  95  Little DIP (0-70)      (Blank rows = not tested)   HAND FUNCTION: Grip strength: Right: 30 lbs; Left: 30 lbs, Lateral pinch: Right: 10 lbs, Left: 13 lbs, and 3 point pinch: Right: 9 lbs, Left: 6 lbs  COORDINATION: Limited because of triggering when attempting to bend or flex third digit SENSATION: Denies any sensory issues  EDEMA: A1 pulleys tender in the large  COGNITION: Overall cognitive status: Within functional limits for tasks assessed     TREATMENT DATE: 09/15/23     Pt reports she is feeling much better, no longer feeling off balance or having the pain in her upper arms.  She reports she has a place on her splint that is rubbing where the padding has come off.    Paraffin: Time: 8 Location: bilateral hands to decrease pain, stiffness and increase tissue mobility Pain and inflammation and decrease stiffness prior to review of home exercises  Patient with custom MC block splints for bilateral  third digits, she continues to wear at nighttime as well as during the day with to prevent composite flexion or fisting activities.  One of her splints the padding has come off on the edge and is rubbing her finger.  Therapist removed prior padding and replaced in entirety of the splint this date, pt reports this is better.  Will continue to monitor for any additional adjustments.   Manual Therapy: Following paraffin, pt was seen for manual therapy techniques performed by therapist for soft tissue mobilization and massage use of foam roller over  the volar surface of the hands and digits in addition to forearm Use of Graston tool #2 performed over palm and for brushing and sweeping over the volar digits to decrease pain, stiffness and promote increased ROM  Therapeutic Exercises:  Following manual therapy, pt seen for therapeutic exercises to improve ROM and strength of bilateral hands. Prayer stretch for composite extension Blocked MC flexion with PIP extension 10 reps AROM Intrinsic 10 reps passive range of motion pain free  Opposition to all digits 10 reps  Patient requires min verbal and tactile cues for proper form and technique but requiring overall less cues for exercises this date.  Continued discussions regarding the importance of modifications to use palm or larger objects to carry or hold objects, enlarging her grips and handles. Using foam roller to enlarge handles, picking up her pocketbook, managing Grocery cart and driving.   PATIENT EDUCATION: Education details: findings of eval and HEP  Person educated: Patient Education method: Explanation, Demonstration, Tactile cues, Verbal cues, and Handouts Education comprehension: verbalized understanding, returned demonstration, verbal cues required, and needs further education    GOALS: Goals reviewed with patient? Yes  LONG TERM GOALS: Target date: 6 wks  Patient be independent home program to increase motion and decrease pain  to less than 3/10. Baseline: Patient no knowledge of home program.  Pain and tenderness 8-9/10.  Triggering several times in the clinic and reported during the day. Goal status: INITIAL  2.  Patient's passive range of motion to bilateral third digits improved to touching palm with no increased pain or triggering. Baseline: Third digit right hand MC 75 and PIP 90.  Left third MCP 85 and PIP 95.  But triggered sensation.  Tenderness and pain 8-9/10.  Patient also showing decreased PIP extension in bilateral thirds -10 to -15 degrees Goal status: INITIAL  3.  Patient verbalize 2-3 modifications and adaptations to decrease triggering as well as pain in bilateral hands. Baseline: Patient no knowledge.  Pain is 8-9/10 over bilateral third digits as well as A1 pulley.  Triggering several times during the day as well as in session.   Goal status: INITIAL  4.  Patient's show composite flexion touching palm to use in ADLs and IADLs with less than 2 episodes of triggering a day Baseline: Patient with several times during the day or every time attempting to do composite flexion triggering at bilateral third digits.  Pain 8-9/10 pain.  Decreased range of motion in bilateral digits Goal status: INITIAL  5.  Grip and prevention strength improved to within normal limits for her age with no increased pain and symptoms to return to prior level of function Baseline: Grip 30 pounds left and right hand lateral pinch on the right 10 pounds left 13 pounds and 3 point pinch 9 and left 6 pounds Goal status: INITIAL    ASSESSMENT:  CLINICAL IMPRESSION: Patient seen today for occupational therapy for bilateral third digit trigger fingers.  Patient reports started in December 24.  Patient not able to get any shots because of allergic reactions and diabetic.  Patient pain 8-9/10 over bilateral third digits as well as A1 pulleys.  Patient showing in session several episodes of triggering.  Also report every attempt of  composite fist third digits triggering or locking.  With worse in the morning.  Patient with decreased flexion in all digits as well as decreased PIP extension in bilateral third digits with -10 to -15 degrees.  Grip and prehension strength decreased in bilateral hands. Pt reported  a potential reaction from iontophoresis last week and an allergy to cortisone and steroids which was not listed on her allergy list. Pt reported bilateral arm pain and aching for a few days and feeling off balance which are similar symptoms in the past after use of steroids for anti inflammatory.  Discontinued iontophoresis and pt will discuss with MD to add cortisone and steroid to her allergy list.  Pt reports reactions have now resolved and she is feeling much better, no pain in upper arms and balance feels restored.  Use of paraffin this date to bilateral UE to decrease pain, increase tissue mobility and ROM of the bilateral hands, pt reports positive results with the combination of paraffin, manual therapy and exercises this date and feels she is progressing with hand function.  Continue to reinforce no active range of motion currently while working towards decreasing inflammation and also reinforced the importance of modifications of tools and tasks for future joint/hand protection.  Patient can benefit from skilled OT services to decrease pain and inflammation and increase motion and strength as well as joint protection and modification education, splinting and modalities to return to prior level of function.  PERFORMANCE DEFICITS: in functional skills including ADLs, IADLs, ROM, strength, pain, flexibility, decreased knowledge of use of DME, and UE functional use,   and psychosocial skills including environmental adaptation and routines and behaviors.   IMPAIRMENTS: are limiting patient from ADLs, IADLs, rest and sleep, play, leisure, and social participation.   COMORBIDITIES: has no other co-morbidities that affects  occupational performance. Patient will benefit from skilled OT to address above impairments and improve overall function.  MODIFICATION OR ASSISTANCE TO COMPLETE EVALUATION: No modification of tasks or assist necessary to complete an evaluation.  OT OCCUPATIONAL PROFILE AND HISTORY: Problem focused assessment: Including review of records relating to presenting problem.  CLINICAL DECISION MAKING: LOW - limited treatment options, no task modification necessary  REHAB POTENTIAL: Good for goals  EVALUATION COMPLEXITY: Low   PLAN:  OT FREQUENCY: 1-2x/week  OT DURATION: 6 weeks  PLANNED INTERVENTIONS: 97168 OT Re-evaluation, 97535 self care/ADL training, 02889 therapeutic exercise, 97530 therapeutic activity, 97140 manual therapy, 97035 ultrasound, 97018 paraffin, 02960 fluidotherapy, 97034 contrast bath, 97033 iontophoresis, orthotics, joint protection and modifications   CONSULTED AND AGREED WITH PLAN OF CARE: Patient  Khamille Beynon, OTR/L,CLT 09/19/2023, 7:47 AM

## 2023-09-22 ENCOUNTER — Ambulatory Visit: Admitting: Occupational Therapy

## 2023-09-22 DIAGNOSIS — M65331 Trigger finger, right middle finger: Secondary | ICD-10-CM | POA: Diagnosis not present

## 2023-09-22 DIAGNOSIS — M79641 Pain in right hand: Secondary | ICD-10-CM

## 2023-09-22 DIAGNOSIS — M25641 Stiffness of right hand, not elsewhere classified: Secondary | ICD-10-CM

## 2023-09-22 DIAGNOSIS — M79642 Pain in left hand: Secondary | ICD-10-CM

## 2023-09-22 NOTE — Therapy (Signed)
 OUTPATIENT OCCUPATIONAL THERAPY ORTHO TREATMENT  Patient Name: Kathryn Owen MRN: [id number] DOB:[birth year], 78 y.o., female Today's Date: 09/22/2023  PCP: Dr Ross MART PROVIDER: Dr Kathlynn  END OF SESSION:  OT End of Session - 09/22/23 0821     Visit Number 9    Number of Visits 12    Date for OT Re-Evaluation 10/02/23    OT Start Time 0820    OT Stop Time 0859    OT Time Calculation (min) 39 min    Activity Tolerance Patient tolerated treatment well    Behavior During Therapy WFL for tasks assessed/performed           Past Medical History:  Diagnosis Date   Allergy history unknown    Arthritis    Asthma    Carpal tunnel syndrome    Fatty liver    GERD (gastroesophageal reflux disease)    Herpes zoster    HLD (hyperlipidemia)    mixed   HTN (hypertension)    Hypercalcemia    Hyperparathyroidism    secondary   Insomnia    Osteopenia    Rosacea    Vitamin B deficiency    Vitamin D deficiency    Past Surgical History:  Procedure Laterality Date   CARDIOVASCULAR STRESS TEST     nml    CESAREAN SECTION     x3   Patient Active Problem List   Diagnosis Date Noted   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  She reports she will tell her PCP to put steroid and cortisone on her allergy list.  Left hand improving really good.  Right third digit still triggering but less. Pt accompanied by: self  PERTINENT HISTORY: Assessment/Plan: 07/08/23 Dr Kathlynn note  Assessment  & Plan Trigger finger  Bilateral trigger finger affects both hands, causing locking of the middle fingers daily, especially in the morning, for over five months. She cannot tolerate steroid injections due to adverse reactions. Hand therapy and paraffin baths are considered as alternative treatments. Surgery remains an option if conservative measures fail, with minimal risk for complications. She will be referred to hand therapy at [facility] in [place] with therapist Deland. Paraffin bath therapy is considered to increase blood flow and reduce pain. Reassessment is planned in one month to evaluate the effectiveness of hand therapy. If symptoms persist, surgical intervention to release the A1 pulley will be discussed.  Dupuytren's contracture  A fibrous band under the skin suggests Dupuytren's contracture, in addition to trigger finger.  Diabetes mellitus without complications  Diabetes is well-controlled with an A1c of 5.5. Blood sugar levels are managed through diet without medication. Diagnoses and all orders for this visit:  Trigger middle finger of left hand - Ambulatory Referral to Occupational Therapy  Trigger middle finger of right hand - Ambulatory Referral to Occupational Therapy  Controlled type 2 diabetes mellitus with stage 3 chronic kidney disease, with long-term current use of insulin (CMS/HHS-HCC)   PRECAUTIONS: None    WEIGHT BEARING  RESTRICTIONS: No  PAIN:  Are you having pain?  Pain 4-6 10 on R 3rd A1pulley and L 1/10   FALLS: Has patient fallen in last 6 months? No  LIVING ENVIRONMENT: Lives with: lives alone   PLOF: Prior to December no triggering.  Stiffness in the fingers.  Patient independent doing housework and cooking and laundry.  Takes care of her 24-year-old great grand daughter in the afternoons 1-5.  Drive  PATIENT GOALS: I want to get the trigger fingers better and the locking and the pain so I do not need surgery.  NEXT MD  VISIT: 4 to 6 weeks  OBJECTIVE:  Note: Objective measures were completed at Evaluation unless otherwise noted.  HAND DOMINANCE: Right  ADLs: Patient limited with increased triggering as well as pain in gripping activities, turning a doorknob holding objects pulling pushing.  FUNCTIONAL OUTCOME MEASURES: Neck session  UPPER EXTREMITY ROM:     Active ROM Right eval Left eval  Shoulder flexion    Shoulder abduction    Shoulder adduction    Shoulder extension    Shoulder internal rotation    Shoulder external rotation    Elbow flexion    Elbow extension    Wrist flexion 62 72  Wrist extension    Wrist ulnar deviation    Wrist radial deviation    Wrist pronation    Wrist supination    (Blank rows = not tested)  Active ROM Right eval Left eval R/L 09/19/23 PROM  Thumb MCP (0-60)     Thumb IP (0-80)     Thumb Radial abd/add (0-55)      Thumb Palmar abd/add (0-45)      Thumb Opposition to Small Finger      Index MCP (0-90) 85 80    Index PIP (0-100) 90  95   Index DIP (0-70)       Long MCP (0-90) 75  85  90/90  Long PIP (0-100) 90  ext -15  95 et -10  -10 ext coming in 95/L100  Long DIP (0-70)     60/70  Ring MCP (0-90)  85  90   Ring PIP (0-100)  90  95   Ring DIP (0-70)       Little MCP (0-90)  90 90    Little PIP (0-100)  85  95   Little DIP (0-70)       (Blank rows = not tested)   HAND FUNCTION: Grip strength: Right: 30 lbs; Left: 30 lbs, Lateral pinch: Right: 10 lbs, Left: 13 lbs, and 3 point pinch: Right: 9 lbs, Left: 6 lbs 09/19/23 Grip strength: Right: 30 lbs; Left: 38 lbs, Lateral pinch: Right: 11 lbs, Left: 13 lbs, and 3 point pinch: Right: 11 lbs, Left: 8 lbs COORDINATION: Limited because of triggering when attempting to bend or flex third digit SENSATION: Denies any sensory issues  EDEMA: A1 pulleys tender in the large  COGNITION: Overall cognitive status: Within functional limits for tasks assessed     TREATMENT DATE: 09/22/23     Assess  patient's passive range of motion.  Improving on the left more than the right. Third MCP flexion 90 degrees.  Right PIP 95 and left 100. Tenderness more in the right than the left but improved from eval Range of motion improving. PROM in L 3rd digit WNL  AROM 5 reps WNL on L 3rd  And R still only PROM pain free Grip and prehension improving.  See flowsheet    Paraffin:  Time: 8 Location: Left hand to decrease  stiffness and increase tissue mobility Pain decrease stiffness prior to review of home exercises  Right hand contrast because of continued increase tenderness over the A1 pulley inflammation over the volar plates and the metacarpals. Patient to continue to do 2-3 times a day on bilateral hands prior to soft tissue and passive range of motion.  Manual Therapy: Following modalities, pt was seen for manual therapy techniques performed by therapist for soft tissue mobilization and massage use of foam roller over the volar surface of the hands and digits in addition to forearm at home Use of Graston tool #6 performed over palm and for brushing and sweeping over the volar digits to decrease pain, stiffness and promote increased ROM-focusing mostly on third digits. Patient with full PIP extension on bilateral hands on third digits after soft tissue. Patient to focus at home same.  Therapeutic Exercises:  Following manual therapy, pt seen for therapeutic exercises to improve ROM and strength of bilateral hands. Prayer stretch for composite extension Blocked MC flexion with PIP extension 10 reps AROM Intrinsic 10 reps passive range of motion pain free -verbal cueing to focus on correct technique keeping MC extended.  As well as going to extension in between each tendon glides. At composite flexion passive range of motion this date to but reinforced to stop prior to pull or pain.  5-8 reps  opposition to all digits 10 reps  Patient requires min verbal and tactile cues for proper form and  technique but requiring -moderate assistance with changes in home program as well as doing technique correctly  Ultrasound at 20% at 3.3 MHz 1.0 intensity for 4 minutes done on bilateral third A1 pulley to decrease inflammation and pain Patient was fitted with a Isotoner glove to use at nighttime on the right hand to decrease inflammation over the metacarpals. Tolerated well with no issues with ultrasound  Continued discussions regarding the importance of modifications to use palm or larger objects to carry or hold objects, enlarging her grips and handles. Using foam roller to enlarge handles, picking up her pocketbook, managing Grocery cart and driving.   PATIENT EDUCATION: Education details: findings of eval and HEP  Person educated: Patient Education method: Explanation, Demonstration, Tactile cues, Verbal cues, and Handouts Education comprehension: verbalized understanding, returned demonstration, verbal cues required, and needs further education    GOALS: Goals reviewed with patient? Yes  LONG TERM GOALS: Target date: 6 wks  Patient be independent home program to increase motion and decrease pain to less than 3/10. Baseline: Patient no knowledge of home program.  Pain and tenderness 8-9/10.  Triggering several times in the clinic and reported during the day. Goal status: INITIAL  2.  Patient's passive range of motion to bilateral third digits improved to touching palm with no increased pain or triggering. Baseline: Third digit right hand MC 75 and PIP 90.  Left third MCP 85 and PIP 95.  But triggered sensation.  Tenderness and pain 8-9/10.  Patient also showing decreased PIP extension in bilateral thirds -10 to -15 degrees Goal status: INITIAL  3.  Patient verbalize 2-3 modifications and adaptations to decrease triggering as well as pain in bilateral hands. Baseline: Patient no knowledge.  Pain is 8-9/10 over bilateral third digits as well as A1 pulley.  Triggering several times  during the day as well as in session.   Goal status: INITIAL  4.  Patient's show composite flexion touching palm to use in ADLs and IADLs with  less than 2 episodes of triggering a day Baseline: Patient with several times during the day or every time attempting to do composite flexion triggering at bilateral third digits.  Pain 8-9/10 pain.  Decreased range of motion in bilateral digits Goal status: INITIAL  5.  Grip and prevention strength improved to within normal limits for her age with no increased pain and symptoms to return to prior level of function Baseline: Grip 30 pounds left and right hand lateral pinch on the right 10 pounds left 13 pounds and 3 point pinch 9 and left 6 pounds Goal status: INITIAL    ASSESSMENT:  CLINICAL IMPRESSION: Patient seen today for occupational therapy for bilateral third digit trigger fingers.  Patient reports started in December 24.  Patient not able to get any shots because of allergic reactions and diabetic.  Patient pain 8-9/10 over bilateral third digits as well as A1 pulleys.  Patient showing in session several episodes of triggering.  Also report every attempt of composite fist third digits triggering or locking.  With worse in the morning.  Patient with decreased flexion in all digits as well as decreased PIP extension in bilateral third digits with -10 to -15 degrees.  Grip and prehension strength decreased in bilateral hands NOW . Patient is left-hand improving greatly.  Tenderness of the A1 pulley 1/10 and on the right 4/10.  Passive range of motion improving in bilateral third digits. PROM on L WNL  and AROM not triggering in session-  Was able to do paraffin on the left hand but patient to continue with contrast on the right because of continued inflammation patient was fitted with a Isotoner glove this date to wear at nighttime.  Focusing on pain-free passive range of motion after modalities to decrease inflammation.  Patient can benefit from skilled  OT services to decrease pain and inflammation and increase motion and strength as well as joint protection and modification education, splinting and modalities to return to prior level of function.  PERFORMANCE DEFICITS: in functional skills including ADLs, IADLs, ROM, strength, pain, flexibility, decreased knowledge of use of DME, and UE functional use,   and psychosocial skills including environmental adaptation and routines and behaviors.   IMPAIRMENTS: are limiting patient from ADLs, IADLs, rest and sleep, play, leisure, and social participation.   COMORBIDITIES: has no other co-morbidities that affects occupational performance. Patient will benefit from skilled OT to address above impairments and improve overall function.  MODIFICATION OR ASSISTANCE TO COMPLETE EVALUATION: No modification of tasks or assist necessary to complete an evaluation.  OT OCCUPATIONAL PROFILE AND HISTORY: Problem focused assessment: Including review of records relating to presenting problem.  CLINICAL DECISION MAKING: LOW - limited treatment options, no task modification necessary  REHAB POTENTIAL: Good for goals  EVALUATION COMPLEXITY: Low   PLAN:  OT FREQUENCY: 1-2x/week  OT DURATION: 6 weeks  PLANNED INTERVENTIONS: 97168 OT Re-evaluation, 97535 self care/ADL training, 02889 therapeutic exercise, 97530 therapeutic activity, 97140 manual therapy, 97035 ultrasound, 97018 paraffin, 02960 fluidotherapy, 97034 contrast bath, 97033 iontophoresis, orthotics, joint protection and modifications   CONSULTED AND AGREED WITH PLAN OF CARE: Patient  Ancel Peters, OTR/L,CLT 09/22/2023, 9:05 AM

## 2023-09-26 ENCOUNTER — Ambulatory Visit: Admitting: Occupational Therapy

## 2023-09-26 DIAGNOSIS — M65331 Trigger finger, right middle finger: Secondary | ICD-10-CM

## 2023-09-26 DIAGNOSIS — M79641 Pain in right hand: Secondary | ICD-10-CM

## 2023-09-26 DIAGNOSIS — M79642 Pain in left hand: Secondary | ICD-10-CM

## 2023-09-26 DIAGNOSIS — M25641 Stiffness of right hand, not elsewhere classified: Secondary | ICD-10-CM

## 2023-09-26 NOTE — Therapy (Signed)
 OUTPATIENT OCCUPATIONAL THERAPY ORTHO TREATMENT/10th visit  Patient Name: Kathryn Owen MRN: [id number] DOB:1945-04-02, 78 y.o., female Today's Date: 09/26/2023  PCP: Dr Ross MART PROVIDER: Dr Kathlynn  END OF SESSION:  OT End of Session - 09/26/23 0836     Visit Number 10    Number of Visits 12    Date for OT Re-Evaluation 10/02/23    OT Start Time 0820    OT Stop Time 0905    OT Time Calculation (min) 45 min    Activity Tolerance Patient tolerated treatment well    Behavior During Therapy WFL for tasks assessed/performed           Past Medical History:  Diagnosis Date   Allergy history unknown    Arthritis    Asthma    Carpal tunnel syndrome    Fatty liver    GERD (gastroesophageal reflux disease)    Herpes zoster    HLD (hyperlipidemia)    mixed   HTN (hypertension)    Hypercalcemia    Hyperparathyroidism    secondary   Insomnia    Osteopenia    Rosacea    Vitamin B deficiency    Vitamin D deficiency    Past Surgical History:  Procedure Laterality Date   CARDIOVASCULAR STRESS TEST     nml    CESAREAN SECTION     x3   Patient Active Problem List   Diagnosis Date Noted   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  She is going to make an appointment with her PCP.  I did take a warm shower.  But hands feels better. Left 1 trigger to once or twice since seen last time Pt accompanied by: self  PERTINENT HISTORY: Assessment/Plan: 07/08/23 Dr Kathlynn  note  Assessment & Plan Trigger finger  Bilateral trigger finger affects both hands, causing locking of the middle fingers daily, especially in the morning, for over five months. She cannot tolerate steroid injections due to adverse reactions. Hand therapy and paraffin baths are considered as alternative treatments. Surgery remains an option if conservative measures fail, with minimal risk for complications. She will be referred to hand therapy at [facility] in [place] with therapist Deland. Paraffin bath therapy is considered to increase blood flow and reduce pain. Reassessment is planned in one month to evaluate the effectiveness of hand therapy. If symptoms persist, surgical intervention to release the A1 pulley will be discussed.  Dupuytren's contracture  A fibrous band under the skin suggests Dupuytren's contracture, in addition to trigger finger.  Diabetes mellitus without complications  Diabetes is well-controlled with an A1c of 5.5. Blood sugar levels are managed through diet without medication. Diagnoses and all orders for this visit:  Trigger middle finger of left hand - Ambulatory Referral to Occupational Therapy  Trigger middle finger of right hand - Ambulatory Referral to Occupational Therapy  Controlled type 2 diabetes mellitus with stage 3 chronic kidney disease, with long-term current use of insulin (CMS/HHS-HCC)   PRECAUTIONS: None  WEIGHT BEARING RESTRICTIONS: No  PAIN:  Are you having pain?  2/10 on R 3rd A1pulley and L 1/10   FALLS: Has patient fallen in last 6 months? No  LIVING ENVIRONMENT: Lives with: lives alone   PLOF: Prior to December no triggering.  Stiffness in the fingers.  Patient independent doing housework and cooking and laundry.  Takes care of her 58-year-old great grand daughter in the afternoons 1-5.  Drive  PATIENT GOALS: I want to get the trigger fingers better and the locking and the pain so I do not need  surgery.  NEXT MD VISIT: 4 to 6 weeks  OBJECTIVE:  Note: Objective measures were completed at Evaluation unless otherwise noted.  HAND DOMINANCE: Right  ADLs: Patient limited with increased triggering as well as pain in gripping activities, turning a doorknob holding objects pulling pushing.  FUNCTIONAL OUTCOME MEASURES: Neck session  UPPER EXTREMITY ROM:     Active ROM Right eval Left eval  Shoulder flexion    Shoulder abduction    Shoulder adduction    Shoulder extension    Shoulder internal rotation    Shoulder external rotation    Elbow flexion    Elbow extension    Wrist flexion 62 72  Wrist extension    Wrist ulnar deviation    Wrist radial deviation    Wrist pronation    Wrist supination    (Blank rows = not tested)  Active ROM Right eval Left eval R/L 09/19/23 PROM  Thumb MCP (0-60)     Thumb IP (0-80)     Thumb Radial abd/add (0-55)      Thumb Palmar abd/add (0-45)      Thumb Opposition to Small Finger      Index MCP (0-90) 85 80    Index PIP (0-100) 90  95   Index DIP (0-70)       Long MCP (0-90) 75  85  90/90  Long PIP (0-100) 90  ext -15  95 et -10  -10 ext coming in 95/L100  Long DIP (0-70)     60/70  Ring MCP (0-90)  85  90   Ring PIP (0-100)  90  95   Ring DIP (0-70)       Little MCP (0-90)  90 90    Little PIP (0-100)  85  95   Little DIP (0-70)       (Blank rows = not tested)   HAND FUNCTION: Grip strength: Right: 30 lbs; Left: 30 lbs, Lateral pinch: Right: 10 lbs, Left: 13 lbs, and 3 point pinch: Right: 9 lbs, Left: 6 lbs 09/19/23 Grip strength: Right: 30 lbs; Left: 38 lbs, Lateral pinch: Right: 11 lbs, Left: 13 lbs, and 3 point pinch: Right: 11 lbs, Left: 8 lbs 09/25/23 Grip strength: Right: 34 lbs; Left: 42 lbs, Lateral pinch: Right: 11 lbs, Left: 13 lbs, and 3 point pinch: Right: 11 lbs, Left: 9 lbs COORDINATION: Limited because of triggering when attempting to bend or flex third digit SENSATION: Denies any sensory issues  EDEMA:  A1 pulleys tender in the large  COGNITION: Overall cognitive status: Within functional limits for tasks assessed     TREATMENT DATE: 09/26/23     Assess patient's passive range of motion.  Pain-free passive range of motion touching palm this date.  In bilateral third digits. tenderness on right third A1 pulley 2/10 left 1/10.  Range of motion improving. -10 extension on PIP on the left.   Grip and prehension improving.  See flowsheet   Patient to continue with right hand contrast because of continued increase tenderness over the A1 pulley inflammation over the volar plates and the metacarpals. Patient to continue to do 2-3 times a day on bilateral hands prior to soft tissue and passive range of motion.  Manual Therapy: Following modalities, pt was seen for manual therapy techniques performed by therapist for soft tissue mobilization and massage use of foam roller over the volar surface of the hands and digits in addition to forearm at home Use of Graston tool #6 performed over palm and for brushing and sweeping over the volar digits to decrease pain, stiffness and promote increased ROM-focusing mostly on third digits. Patient with full PIP extension on bilateral hands on third digits after soft tissue. Patient to focus at home same.  Therapeutic Exercises:  therapeutic exercises to improve ROM and strength of bilateral hands. Prayer stretch for composite extension Blocked MC flexion with PIP extension 10 reps AROM Intrinsic 10 reps passive range of motion pain free -verbal cueing to focus on correct technique keeping MC extended.  As well as going to extension in between each tendon glides. At composite flexion passive range of motion this date to but reinforced to stop prior to pull or pain.  5-8 reps  opposition to all digits 10 reps Done placing hold for composite flexion 2 x 5 reps on bilateral hands.  No triggering no pain after passive range of motion.  Patient requires min  verbal and tactile cues for proper form and technique but requiring -moderate assistance with changes in home program as well as doing technique correctly  Ultrasound at 20% at 3.3 MHz 1.0 intensity for 4 minutes done on bilateral third A1 pulley to decrease inflammation and pain Patient was fitted with a Isotoner glove to use at nighttime on the right hand to decrease inflammation over the metacarpals. Tolerated well with no issues with ultrasound  Continued discussions regarding the importance of modifications to use palm or larger objects to carry or hold objects, enlarging her grips and handles. Using foam roller to enlarge handles, picking up her pocketbook, managing Grocery cart and driving.   PATIENT EDUCATION: Education details: findings of eval and HEP  Person educated: Patient Education method: Explanation, Demonstration, Tactile cues, Verbal cues, and Handouts Education comprehension: verbalized understanding, returned demonstration, verbal cues required, and needs further education    GOALS: Goals reviewed with patient? Yes  LONG TERM GOALS: Target date: 6 wks  Patient be independent home program to increase motion and decrease pain to less than 3/10. Baseline: Patient no knowledge of home program.  Pain and tenderness 8-9/10.  Triggering several times in the clinic and reported during the day. Goal status: INITIAL  2.  Patient's passive range of motion to bilateral third digits improved to touching palm with no increased pain or triggering. Baseline: Third digit right hand MC 75 and PIP 90.  Left third MCP 85 and PIP 95.  But triggered sensation.  Tenderness and pain 8-9/10.  Patient also showing decreased PIP extension in bilateral thirds -10 to -15 degrees Goal status: INITIAL  3.  Patient verbalize 2-3 modifications and adaptations to decrease triggering as well as pain in bilateral hands. Baseline: Patient no knowledge.  Pain is 8-9/10 over bilateral third digits as  well as A1 pulley.  Triggering several times during the day as well as in session.   Goal status: INITIAL  4.  Patient's show composite flexion touching palm to use in ADLs and  IADLs with less than 2 episodes of triggering a day Baseline: Patient with several times during the day or every time attempting to do composite flexion triggering at bilateral third digits.  Pain 8-9/10 pain.  Decreased range of motion in bilateral digits Goal status: INITIAL  5.  Grip and prevention strength improved to within normal limits for her age with no increased pain and symptoms to return to prior level of function Baseline: Grip 30 pounds left and right hand lateral pinch on the right 10 pounds left 13 pounds and 3 point pinch 9 and left 6 pounds Goal status: INITIAL    ASSESSMENT:  CLINICAL IMPRESSION: Patient seen today for occupational therapy for bilateral third digit trigger fingers.  Patient reports started in December 24.  Patient not able to get any shots because of allergic reactions and diabetic.  Patient pain 8-9/10 over bilateral third digits as well as A1 pulleys.  Patient showing in session several episodes of triggering.  Also report every attempt of composite fist third digits triggering or locking.  With worse in the morning.  Patient with decreased flexion in all digits as well as decreased PIP extension in bilateral third digits with -10 to -15 degrees.  Grip and prehension strength decreased in bilateral hands NOW .  Patient's pain, triggering as well as passive range of motion improved greatly since start of care.  Tenderness of the A1 pulleys of the third digit is 1-2/10-  Passive range of motion improving in bilateral third digits able to touch palm.  Initiated this date active range of motion placement hold pain-free.  Did not initiate active range of motion.  Patient can gradually wean out of MC block splints for Grasser functional light task.  But splints on with any pulling, squeezing,  sustained grip.  Patient continues to use Isotoner glove at nighttime focusing on pain-free passive range of motion after modalities to decrease inflammation.  Patient can benefit from skilled OT services to decrease pain and inflammation and increase motion and strength as well as joint protection and modification education, splinting and modalities to return to prior level of function.  PERFORMANCE DEFICITS: in functional skills including ADLs, IADLs, ROM, strength, pain, flexibility, decreased knowledge of use of DME, and UE functional use,   and psychosocial skills including environmental adaptation and routines and behaviors.   IMPAIRMENTS: are limiting patient from ADLs, IADLs, rest and sleep, play, leisure, and social participation.   COMORBIDITIES: has no other co-morbidities that affects occupational performance. Patient will benefit from skilled OT to address above impairments and improve overall function.  MODIFICATION OR ASSISTANCE TO COMPLETE EVALUATION: No modification of tasks or assist necessary to complete an evaluation.  OT OCCUPATIONAL PROFILE AND HISTORY: Problem focused assessment: Including review of records relating to presenting problem.  CLINICAL DECISION MAKING: LOW - limited treatment options, no task modification necessary  REHAB POTENTIAL: Good for goals  EVALUATION COMPLEXITY: Low   PLAN:  OT FREQUENCY: 1-2x/week  OT DURATION: 6 weeks  PLANNED INTERVENTIONS: 97168 OT Re-evaluation, 97535 self care/ADL training, 02889 therapeutic exercise, 97530 therapeutic activity, 97140 manual therapy, 97035 ultrasound, 97018 paraffin, 02960 fluidotherapy, 97034 contrast bath, 97033 iontophoresis, orthotics, joint protection and modifications   CONSULTED AND AGREED WITH PLAN OF CARE: Patient  Ancel Peters, OTR/L,CLT 09/26/2023, 3:09 PM

## 2023-09-29 ENCOUNTER — Ambulatory Visit: Admitting: Occupational Therapy

## 2023-10-03 ENCOUNTER — Ambulatory Visit: Admitting: Occupational Therapy

## 2023-10-06 ENCOUNTER — Ambulatory Visit: Attending: Orthopedic Surgery | Admitting: Occupational Therapy

## 2023-10-06 DIAGNOSIS — M25641 Stiffness of right hand, not elsewhere classified: Secondary | ICD-10-CM | POA: Diagnosis present

## 2023-10-06 DIAGNOSIS — M25642 Stiffness of left hand, not elsewhere classified: Secondary | ICD-10-CM | POA: Diagnosis present

## 2023-10-06 DIAGNOSIS — M79642 Pain in left hand: Secondary | ICD-10-CM | POA: Diagnosis present

## 2023-10-06 DIAGNOSIS — M65331 Trigger finger, right middle finger: Secondary | ICD-10-CM | POA: Diagnosis present

## 2023-10-06 DIAGNOSIS — M79641 Pain in right hand: Secondary | ICD-10-CM | POA: Diagnosis present

## 2023-10-06 DIAGNOSIS — M65332 Trigger finger, left middle finger: Secondary | ICD-10-CM | POA: Diagnosis present

## 2023-10-06 NOTE — Therapy (Signed)
 OUTPATIENT OCCUPATIONAL THERAPY ORTHO TREATMENT/RECERT  Patient Name: Kathryn Owen MRN: [id number] DOB:[birth year], 78 y.o., female Today's Date: 10/06/2023  PCP: Dr Ross MART PROVIDER: Dr Kathlynn  END OF SESSION:  OT End of Session - 10/06/23 0820     Visit Number 11    Number of Visits 15    Date for OT Re-Evaluation 11/10/23    OT Start Time 0820    OT Stop Time 0911    OT Time Calculation (min) 51 min    Activity Tolerance Patient tolerated treatment well    Behavior During Therapy WFL for tasks assessed/performed           Past Medical History:  Diagnosis Date   Allergy history unknown    Arthritis    Asthma    Carpal tunnel syndrome    Fatty liver    GERD (gastroesophageal reflux disease)    Herpes zoster    HLD (hyperlipidemia)    mixed   HTN (hypertension)    Hypercalcemia    Hyperparathyroidism    secondary   Insomnia    Osteopenia    Rosacea    Vitamin B deficiency    Vitamin D deficiency    Past Surgical History:  Procedure Laterality Date   CARDIOVASCULAR STRESS TEST     nml    CESAREAN SECTION     x3   Patient Active Problem List   Diagnosis Date Noted   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  But my whole body aches in her joints in the morning.  After rolled out of bed take a warm shower and as the day goes he gets little better.  I did see my PCP last week and she did blood work.  All my fingers is just more stiff and last night my right third finger went numb.  My middle finger still wants to click when I do my exercises but  with use it is better. Pt accompanied by: self  PERTINENT HISTORY: Assessment/Plan: 07/08/23 Dr Kathlynn note  Assessment & Plan Trigger finger  Bilateral trigger finger affects both hands, causing locking of the middle fingers daily, especially in the morning, for over five months. She cannot tolerate steroid injections due to adverse reactions. Hand therapy and paraffin baths are considered as alternative treatments. Surgery remains an option if conservative measures fail, with minimal risk for complications. She will be referred to hand therapy at [facility] in [place] with therapist Deland. Paraffin bath therapy is considered to increase blood flow and reduce pain. Reassessment is planned in one month to evaluate the effectiveness of hand therapy. If symptoms persist, surgical intervention to release the A1 pulley will be discussed.  Dupuytren's contracture  A fibrous band under the skin suggests Dupuytren's contracture, in addition to trigger finger.  Diabetes mellitus without complications  Diabetes is well-controlled with an A1c of 5.5. Blood sugar levels are managed through diet without medication. Diagnoses and all orders for this visit:  Trigger middle finger of left hand - Ambulatory Referral to Occupational Therapy  Trigger middle finger of right hand -  Ambulatory Referral to Occupational Therapy  Controlled type 2 diabetes mellitus with stage 3 chronic kidney disease, with long-term current use of insulin (CMS/HHS-HCC)   PRECAUTIONS: None    WEIGHT BEARING RESTRICTIONS: No  PAIN:  Are you having pain?  2/10 on R 3rd A1pulley and L 1/10   FALLS: Has patient fallen in last 6 months? No  LIVING ENVIRONMENT: Lives with: lives alone   PLOF: Prior to December no triggering.  Stiffness in the fingers.  Patient independent doing housework and cooking and laundry.  Takes care of her 62-year-old great grand daughter in the afternoons 1-5.  Drive  PATIENT  GOALS: I want to get the trigger fingers better and the locking and the pain so I do not need surgery.  NEXT MD VISIT: 4 to 6 weeks  OBJECTIVE:  Note: Objective measures were completed at Evaluation unless otherwise noted.  HAND DOMINANCE: Right  ADLs: Patient limited with increased triggering as well as pain in gripping activities, turning a doorknob holding objects pulling pushing.  FUNCTIONAL OUTCOME MEASURES: Neck session  UPPER EXTREMITY ROM:     Active ROM Right eval Left eval  Shoulder flexion    Shoulder abduction    Shoulder adduction    Shoulder extension    Shoulder internal rotation    Shoulder external rotation    Elbow flexion    Elbow extension    Wrist flexion 62 72  Wrist extension    Wrist ulnar deviation    Wrist radial deviation    Wrist pronation    Wrist supination    (Blank rows = not tested)  Active ROM Right eval Left eval R/L 09/19/23 PROM  Thumb MCP (0-60)     Thumb IP (0-80)     Thumb Radial abd/add (0-55)      Thumb Palmar abd/add (0-45)      Thumb Opposition to Small Finger      Index MCP (0-90) 85 80    Index PIP (0-100) 90  95   Index DIP (0-70)       Long MCP (0-90) 75  85  90/90  Long PIP (0-100) 90  ext -15  95 et -10  -10 ext coming in 95/L100  Long DIP (0-70)     60/70  Ring MCP (0-90)  85  90   Ring PIP (0-100)  90  95   Ring DIP (0-70)       Little MCP (0-90)  90 90    Little PIP (0-100)  85  95   Little DIP (0-70)       (Blank rows = not tested)   HAND FUNCTION: Grip strength: Right: 30 lbs; Left: 30 lbs, Lateral pinch: Right: 10 lbs, Left: 13 lbs, and 3 point pinch: Right: 9 lbs, Left: 6 lbs 09/19/23 Grip strength: Right: 30 lbs; Left: 38 lbs, Lateral pinch: Right: 11 lbs, Left: 13 lbs, and 3 point pinch: Right: 11 lbs, Left: 8 lbs 09/25/23 Grip strength: Right: 34 lbs; Left: 42 lbs, Lateral pinch: Right: 11 lbs, Left: 13 lbs, and 3 point pinch: Right: 11 lbs, Left: 9 lbs 8/4//25 Grip strength: Right: 18 lbs; Left:  32 lbs, Lateral pinch: Right: 9 lbs, Left: 11 lbs, and 3 point pinch: Right: 9 lbs, Left: 8 lbs COORDINATION: Limited because of triggering when attempting to bend or flex third digit SENSATION: Denies any sensory issues  EDEMA: A1 pulleys tender in the large  COGNITION: Overall cognitive status: Within functional limits for tasks assessed  TREATMENT DATE: 10/06/23      Patient continues to report increased stiffness and pain in all her joints.  Hard time getting out of bed.  Moving. She using a reacher to pick things up from the floor.  Hard time getting her shoes on. Patient did see her PCP last week and they did some blood work.  Await results. Appointment in early September to see physical therapy for shoulders and back   Patient tenderness over the A1 pulleys as still decreased at the third right 1/10 on the left 0/10. More stiffness in the right than the left. Passive range of motion decreased at the right MCP to 60 degrees left 90 PIP flexion 95 on bilateral with a click if past 95. Left composite flexion can touch palm after contrast pain-free but patient to focus on the last 10 degrees of PIP extension Right to the 2 fingers of the palm.  If attempts to touch palm a click or trigger in the third digit patient the same to focus on PIP extension in between Grip and prehension strength decreased compared to prior visit.  See flowsheets  Patient to continue with her MC block splints.  Recommend for her to use her Isotoner glove during the day and not at nighttime.   Fluidotherapy today with contrast 2 times ice 1 minute To decrease inflammation decrease stiffness increase motion 10 minutes tolerated well  Manual Therapy: Following modalities, pt was seen for manual therapy techniques performed by therapist for soft tissue mobilization and massage use of foam roller over the volar surface of the hands and digits in addition to forearm at home Use of Graston tool #6  performed over palm and for brushing and sweeping over the volar digits to decrease pain, stiffness and promote increased ROM-focusing mostly on third digits  Patient with full PIP extension on bilateral hands on third digits after soft tissue. Patient to focus at home same.  Therapeutic Exercises:  therapeutic exercises to improve ROM and strength of bilateral hands. Prayer stretch for composite extension Blocked MC flexion with PIP extension 10 reps AROM Intrinsic 10 reps passive range of motion pain free -verbal cueing to focus on correct technique keeping MC extended.  As well as going to extension in between each tendon glides. At composite flexion passive range of motion this date to but reinforced to stop prior to pull or pain.  5-8 reps  opposition to all digits 10 reps Done placing hold for composite flexion 2 x 5 reps on bilateral hands.  No triggering no pain after passive range of motion.  Patient requires min verbal and tactile cues for proper form and technique but requiring -moderate assistance with changes in home program as well as doing technique correctly   Patient progressed limited by increased inflammation and pain in multiple joints.  Await results from blood work and intervention by PCP.   PATIENT EDUCATION: Education details: findings of eval and HEP  Person educated: Patient Education method: Explanation, Demonstration, Tactile cues, Verbal cues, and Handouts Education comprehension: verbalized understanding, returned demonstration, verbal cues required, and needs further education    GOALS: Goals reviewed with patient? Yes  LONG TERM GOALS: Target date: 6 wks  Patient be independent home program to increase motion and decrease pain to less than 3/10. Baseline: Patient no knowledge of home program.  Pain and tenderness 8-9/10.  Triggering several times in the clinic and reported during the day. Goal status: Met  2.  Patient's passive range of motion to  bilateral third digits improved to touching palm with no increased pain or triggering. Baseline: Third digit right hand MC 75 and PIP 90.  Left third MCP 85 and PIP 95.  But triggered sensation.  Tenderness and pain 8-9/10.  Patient also showing decreased PIP extension in bilateral thirds -10 to -15 degrees NOW patient pain decreased to 0-1/10.  Was able to touch palm with passive range of motion the last week or 2.  But  increase inflammation in her body and pain with movement in multiple joints-digits your PCP and done blood work await results Goal status: Progressing  3.  Patient verbalize 2-3 modifications and adaptations to decrease triggering as well as pain in bilateral hands. Baseline: Patient no knowledge.  Pain is 8-9/10 over bilateral third digits as well as A1 pulley.  Triggering several times during the day as well as in session.   Goal status: Met  4.  Patient's show composite flexion touching palm to use in ADLs and IADLs with less than 2 episodes of triggering a day Baseline: Patient with several times during the day or every time attempting to do composite flexion triggering at bilateral third digits.  Pain 8-9/10 pain.  Decreased range of motion in bilateral digits Goal status: Progressing  5.  Grip and prevention strength improved to within normal limits for her age with no increased pain and symptoms to return to prior level of function Baseline: Grip 30 pounds left and right hand lateral pinch on the right 10 pounds left 13 pounds and 3 point pinch 9 and left 6 pounds Goal status: progressing    ASSESSMENT:  CLINICAL IMPRESSION: Patient seen today for occupational therapy for bilateral third digit trigger fingers.  Patient reports started in December 24.  Patient not able to get any shots because of allergic reactions and diabetic.  Patient pain at eval  8-9/10 over bilateral third digits as well as A1 pulleys.  Patient showing in session several episodes of triggering.   Also report every attempt of composite fist third digits triggering or locking.  With worse in the morning.  Patient with decreased flexion in all digits as well as decreased PIP extension in bilateral third digits with -10 to -15 degrees.  Grip and prehension strength decreased in bilateral hands NOW .  Patient's pain, triggering as well as passive range of motion improved greatly since start of care.  Tenderness of the A1 pulleys of the third digit is 0-1/10-  Passive range of motion improving in bilateral third digits able to touch palm the last week or 2 but patient gradually reporting increased multiple joint stiffness and pain in her body.  With increased inflammation.  Patient seen last week her PCP and done some blood work.  Await results and intervention.  Patient's composite passive range of motion decreased on the right not able to touch palm.  But patient also reports needs to use a reacher to pick objects off the floor forcing her to use repetitive pinching on the right.  Grip and prehension decreased since last visit.  Await results of blood work and intervention from PCP.  Decreased her to 1 time a week to continue to monitor for patient to maintain her progress.  Patient to use her Isotoner glove during the day if causing discomfort at night.  Patient can benefit from skilled OT services to decrease pain and inflammation and increase motion and strength as well as joint protection and modification education, splinting and modalities to return to prior level  of function.  PERFORMANCE DEFICITS: in functional skills including ADLs, IADLs, ROM, strength, pain, flexibility, decreased knowledge of use of DME, and UE functional use,   and psychosocial skills including environmental adaptation and routines and behaviors.   IMPAIRMENTS: are limiting patient from ADLs, IADLs, rest and sleep, play, leisure, and social participation.   COMORBIDITIES: has no other co-morbidities that affects occupational  performance. Patient will benefit from skilled OT to address above impairments and improve overall function.  MODIFICATION OR ASSISTANCE TO COMPLETE EVALUATION: No modification of tasks or assist necessary to complete an evaluation.  OT OCCUPATIONAL PROFILE AND HISTORY: Problem focused assessment: Including review of records relating to presenting problem.  CLINICAL DECISION MAKING: LOW - limited treatment options, no task modification necessary  REHAB POTENTIAL: Good for goals  EVALUATION COMPLEXITY: Low   PLAN:  OT FREQUENCY: 1-2x/week  OT DURATION: 6 weeks  PLANNED INTERVENTIONS: 97168 OT Re-evaluation, 97535 self care/ADL training, 02889 therapeutic exercise, 97530 therapeutic activity, 97140 manual therapy, 97035 ultrasound, 97018 paraffin, 02960 fluidotherapy, 97034 contrast bath, 97033 iontophoresis, orthotics, joint protection and modifications   CONSULTED AND AGREED WITH PLAN OF CARE: Patient  Ancel Peters, OTR/L,CLT 10/06/2023, 9:29 AM

## 2023-10-10 ENCOUNTER — Ambulatory Visit: Admitting: Occupational Therapy

## 2023-10-13 ENCOUNTER — Ambulatory Visit: Admitting: Occupational Therapy

## 2023-10-13 DIAGNOSIS — M79641 Pain in right hand: Secondary | ICD-10-CM

## 2023-10-13 DIAGNOSIS — M79642 Pain in left hand: Secondary | ICD-10-CM

## 2023-10-13 DIAGNOSIS — M65331 Trigger finger, right middle finger: Secondary | ICD-10-CM | POA: Diagnosis not present

## 2023-10-13 DIAGNOSIS — M25641 Stiffness of right hand, not elsewhere classified: Secondary | ICD-10-CM

## 2023-10-13 NOTE — Therapy (Signed)
 OUTPATIENT OCCUPATIONAL THERAPY ORTHO TREATMENT  Patient Name: Kathryn Owen MRN: [id number] DOB:1945-06-05, 78 y.o., female Today's Date: 10/13/2023  PCP: Dr Ross MART PROVIDER: Dr Kathlynn  END OF SESSION:  OT End of Session - 10/13/23 1435     Visit Number 12    Number of Visits 15    Date for OT Re-Evaluation 11/10/23    OT Start Time 1433    Activity Tolerance Patient tolerated treatment well    Behavior During Therapy [facility] for tasks assessed/performed           Past Medical History:  Diagnosis Date   Allergy history unknown    Arthritis    Asthma    Carpal tunnel syndrome    Fatty liver    GERD (gastroesophageal reflux disease)    Herpes zoster    HLD (hyperlipidemia)    mixed   HTN (hypertension)    Hypercalcemia    Hyperparathyroidism    secondary   Insomnia    Osteopenia    Rosacea    Vitamin B deficiency    Vitamin D deficiency    Past Surgical History:  Procedure Laterality Date   CARDIOVASCULAR STRESS TEST     nml    CESAREAN SECTION     x3   Patient Active Problem List   Diagnosis Date Noted   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  My rheumatoid factor was okay.  But I think my protein was increased and it showed inflammation in my body.  I have been eating up to about 2 weeks ago this frozen gluten-free food.  But I stopped it.  My daughter moved me in with her because I cannot drive the moment and I cannot get dressed in the morning.  All my joints hurts.  I do not have a physical therapy appointment until  in 3 weeks but she can maybe see me earlier pt accompanied by: self  PERTINENT HISTORY: Assessment/Plan: 07/08/23 Dr Kathlynn note  Assessment & Plan Trigger finger  Bilateral trigger finger affects both hands, causing locking of the middle fingers daily, especially in the morning, for over five months. She cannot tolerate steroid injections due to adverse reactions. Hand therapy and paraffin baths are considered as alternative treatments. Surgery remains an option if conservative measures fail, with minimal risk for complications. She will be referred to hand therapy at [facility] in [place] with therapist Deland. Paraffin bath therapy is considered to increase blood flow and reduce pain. Reassessment is planned in one month to evaluate the effectiveness of hand therapy. If symptoms persist, surgical intervention to release the A1 pulley will be discussed.  Dupuytren's contracture  A fibrous band under the skin suggests Dupuytren's contracture, in addition to trigger finger.  Diabetes mellitus without complications  Diabetes is well-controlled with an A1c of 5.5. Blood sugar levels are managed through diet without medication. Diagnoses and all orders for this visit:  Trigger middle finger of left hand - Ambulatory Referral to Occupational Therapy  Trigger middle finger of right hand - Ambulatory Referral to Occupational  Therapy  Controlled type 2 diabetes mellitus with stage 3 chronic kidney disease, with long-term current use of insulin (CMS/HHS-HCC)   PRECAUTIONS: None    WEIGHT BEARING RESTRICTIONS: No  PAIN:  Are you having pain? 4/10 R 3rd A1pulley and L 1/10   FALLS: Has patient fallen in last 6 months? No  LIVING ENVIRONMENT: Lives with: lives alone   PLOF: Prior to December no triggering.  Stiffness in the fingers.  Patient independent doing housework and cooking and laundry.  Takes care of her 34-year-old great grand daughter in the afternoons 1-5.   Drive  PATIENT GOALS: I want to get the trigger fingers better and the locking and the pain so I do not need surgery.  NEXT MD VISIT: 4 to 6 weeks  OBJECTIVE:  Note: Objective measures were completed at Evaluation unless otherwise noted.  HAND DOMINANCE: Right  ADLs: Patient limited with increased triggering as well as pain in gripping activities, turning a doorknob holding objects pulling pushing.  FUNCTIONAL OUTCOME MEASURES: Neck session  UPPER EXTREMITY ROM:     Active ROM Right eval Left eval  Shoulder flexion    Shoulder abduction    Shoulder adduction    Shoulder extension    Shoulder internal rotation    Shoulder external rotation    Elbow flexion    Elbow extension    Wrist flexion 62 72  Wrist extension    Wrist ulnar deviation    Wrist radial deviation    Wrist pronation    Wrist supination    (Blank rows = not tested)  Active ROM Right eval Left eval R/L 09/19/23 PROM  Thumb MCP (0-60)     Thumb IP (0-80)     Thumb Radial abd/add (0-55)      Thumb Palmar abd/add (0-45)      Thumb Opposition to Small Finger      Index MCP (0-90) 85 80    Index PIP (0-100) 90  95   Index DIP (0-70)       Long MCP (0-90) 75  85  90/90  Long PIP (0-100) 90  ext -15  95 et -10  -10 ext coming in 95/L100  Long DIP (0-70)     60/70  Ring MCP (0-90)  85  90   Ring PIP (0-100)  90  95   Ring DIP (0-70)       Little MCP (0-90)  90 90    Little PIP (0-100)  85  95   Little DIP (0-70)       (Blank rows = not tested)   HAND FUNCTION: Grip strength: Right: 30 lbs; Left: 30 lbs, Lateral pinch: Right: 10 lbs, Left: 13 lbs, and 3 point pinch: Right: 9 lbs, Left: 6 lbs 09/19/23 Grip strength: Right: 30 lbs; Left: 38 lbs, Lateral pinch: Right: 11 lbs, Left: 13 lbs, and 3 point pinch: Right: 11 lbs, Left: 8 lbs 09/25/23 Grip strength: Right: 34 lbs; Left: 42 lbs, Lateral pinch: Right: 11 lbs, Left: 13 lbs, and 3 point pinch: Right: 11 lbs, Left: 9 lbs 8/4//25 Grip strength:  Right: 18 lbs; Left: 32 lbs, Lateral pinch: Right: 9 lbs, Left: 11 lbs, and 3 point pinch: Right: 9 lbs, Left: 8 lbs COORDINATION: Limited because of triggering when attempting to bend or flex third digit SENSATION: Denies any sensory issues  EDEMA: A1 pulleys tender in the large  COGNITION: Overall cognitive status: Within functional limits for tasks assessed     TREATMENT DATE: 811/25  Patient continues to report increased stiffness and pain in all her joints.  Hard time getting out of bed.  Moving.   Cannot drive and cannot take care of herself in the morning.  Her daughter moved her in with her.  She using a reacher to pick things up from the floor.  Even use it to help her put her socks and shoes on with the right hand Patient did see her PCP last week and they did some blood work.   Appointment in early September to see physical therapy for shoulders and back-but they may be can work my in this week  Contrast 8 minutes to bilateral hands to decrease edema pain decrease stiffness patient to continue at home Patient tenderness over the A1 pulleys as still decreased at the third right 4/10 on the left 1/10. Patient tolerating passive range of motion to [facility] flexion as well as intrinsic 10 reps pain-free Left composite flexion can touch palm after contrast pain-free Right composite flexion 1 finger out of palm.  Pain-free Patient to continue with her MC block splints.  Recommend for her to use her Isotoner glove during the day and not at nighttime.   Manual Therapy: Following modalities, pt was seen for manual therapy techniques performed by therapist for soft tissue mobilization and massage use of foam roller over the volar surface of the hands and digits in addition to forearm at home Use of Graston tool #6 performed over palm and for brushing and sweeping over the volar digits to decrease pain, stiffness and promote increased ROM-focusing mostly on third digits  Patient with  full PIP extension on bilateral hands on third digits after soft tissue. Patient to focus at home same.  Therapeutic Exercises:  therapeutic exercises to improve ROM and strength of bilateral hands. Active assisted range of motion for wrist flexion extension bilateral hands 10 reps pain-free Blocked MC flexion with PIP extension 10 reps AROM Intrinsic 10 reps passive range of motion pain free -verbal cueing to focus on correct technique keeping MC extended.  As well as going to extension in between each tendon glides. At composite flexion passive range of motion pain-free 5-8 reps  opposition to all digits 10 reps  Patient requires min verbal and tactile cues for proper form and technique but requiring -moderate assistance with changes in home program as well as doing technique correctly  Patient was educated in adaptive equipment using a sock aid to don her socks.  Patient was using a reacher to don socks and shoes.  Patient was provided with information for a hip kit to assist with lower body dressing.   Patient progression limited by increased inflammation and pain in multiple joints.   Recommend for patient to talk to her PCP to get a referral to diabetic counseling with nutritionist/dietitian at [facility] information provided    PATIENT EDUCATION: Education details: findings of eval and HEP  Person educated: Patient Education method: Explanation, Demonstration, Tactile cues, Verbal cues, and Handouts Education comprehension: verbalized understanding, returned demonstration, verbal cues required, and needs further education    GOALS: Goals reviewed with patient? Yes  LONG TERM GOALS: Target date: 6 wks  Patient be independent home program to increase motion and decrease pain to less than 3/10. Baseline: Patient no knowledge of home program.  Pain and tenderness 8-9/10.  Triggering several times in the clinic and reported during the day. Goal status: Met  2.  Patient's passive  range of motion to bilateral third digits improved to touching  palm with no increased pain or triggering. Baseline: Third digit right hand MC 75 and PIP 90.  Left third MCP 85 and PIP 95.  But triggered sensation.  Tenderness and pain 8-9/10.  Patient also showing decreased PIP extension in bilateral thirds -10 to -15 degrees NOW patient pain decreased to 0-1/10.  Was able to touch palm with passive range of motion the last week or 2.  But  increase inflammation in her body and pain with movement in multiple joints-digits your PCP and done blood work await results Goal status: Progressing  3.  Patient verbalize 2-3 modifications and adaptations to decrease triggering as well as pain in bilateral hands. Baseline: Patient no knowledge.  Pain is 8-9/10 over bilateral third digits as well as A1 pulley.  Triggering several times during the day as well as in session.   Goal status: Met  4.  Patient's show composite flexion touching palm to use in ADLs and IADLs with less than 2 episodes of triggering a day Baseline: Patient with several times during the day or every time attempting to do composite flexion triggering at bilateral third digits.  Pain 8-9/10 pain.  Decreased range of motion in bilateral digits Goal status: Progressing  5.  Grip and prevention strength improved to within normal limits for her age with no increased pain and symptoms to return to prior level of function Baseline: Grip 30 pounds left and right hand lateral pinch on the right 10 pounds left 13 pounds and 3 point pinch 9 and left 6 pounds Goal status: progressing    ASSESSMENT:  CLINICAL IMPRESSION: Patient seen today for occupational therapy for bilateral third digit trigger fingers.  Patient reports started in December 24.  Patient not able to get any shots because of allergic reactions and diabetic.  Patient pain at eval  8-9/10 over bilateral third digits as well as A1 pulleys.  Patient showing in session several episodes  of triggering.  Also report every attempt of composite fist third digits triggering or locking.  With worse in the morning.  Patient with decreased flexion in all digits as well as decreased PIP extension in bilateral third digits with -10 to -15 degrees.  Grip and prehension strength decreased in bilateral hands NOW .  Patient's pain, triggering as well as passive range of motion improved greatly since start of care.  Tenderness of the A1 pulleys of the third digit is 0-4/10-  With patient gradually reporting increased multiple joint stiffness and pain in her body.  With increased inflammation per Lab work.  Patient seen last week her PCP and done some blood work.  Recommend for patient to see diabetic counselor.  Appear patient was eating for months up to 2 weeks ago frozen gluten-free meals.  Patient is left-hand doing better than the right.  With pain composite flexion 1 cm out of palm pain-free.  But patient also reports needs to use a reacher to pick objects off the floor forcing her to use repetitive pinching on the right.   Educated patient on adaptive equipment to help her get dressed.  Information provided decreased her to 1 time a week to continue to monitor for patient to maintain her progress with her flareup and inflammation and pain.  Patient to use her Isotoner glove during the day if causing discomfort at night.  Patient can benefit from skilled OT services to decrease pain and inflammation and increase motion and strength as well as joint protection and modification education, splinting and modalities to  return to prior level of function.  PERFORMANCE DEFICITS: in functional skills including ADLs, IADLs, ROM, strength, pain, flexibility, decreased knowledge of use of DME, and UE functional use,   and psychosocial skills including environmental adaptation and routines and behaviors.   IMPAIRMENTS: are limiting patient from ADLs, IADLs, rest and sleep, play, leisure, and social participation.    COMORBIDITIES: has no other co-morbidities that affects occupational performance. Patient will benefit from skilled OT to address above impairments and improve overall function.  MODIFICATION OR ASSISTANCE TO COMPLETE EVALUATION: No modification of tasks or assist necessary to complete an evaluation.  OT OCCUPATIONAL PROFILE AND HISTORY: Problem focused assessment: Including review of records relating to presenting problem.  CLINICAL DECISION MAKING: LOW - limited treatment options, no task modification necessary  REHAB POTENTIAL: Good for goals  EVALUATION COMPLEXITY: Low   PLAN:  OT FREQUENCY: 1-2x/week  OT DURATION: 6 weeks  PLANNED INTERVENTIONS: 97168 OT Re-evaluation, 97535 self care/ADL training, 02889 therapeutic exercise, 97530 therapeutic activity, 97140 manual therapy, 97035 ultrasound, 97018 paraffin, 02960 fluidotherapy, 97034 contrast bath, 97033 iontophoresis, orthotics, joint protection and modifications   CONSULTED AND AGREED WITH PLAN OF CARE: Patient  Ancel Peters, OTR/L,CLT 10/13/2023, 2:36 PM

## 2023-10-17 ENCOUNTER — Ambulatory Visit: Admitting: Occupational Therapy

## 2023-10-20 ENCOUNTER — Ambulatory Visit: Admitting: Occupational Therapy

## 2023-10-24 ENCOUNTER — Ambulatory Visit: Admitting: Occupational Therapy

## 2023-10-24 DIAGNOSIS — M79641 Pain in right hand: Secondary | ICD-10-CM

## 2023-10-24 DIAGNOSIS — M65331 Trigger finger, right middle finger: Secondary | ICD-10-CM

## 2023-10-24 DIAGNOSIS — M25641 Stiffness of right hand, not elsewhere classified: Secondary | ICD-10-CM

## 2023-10-24 DIAGNOSIS — M79642 Pain in left hand: Secondary | ICD-10-CM

## 2023-10-24 NOTE — Therapy (Signed)
 OUTPATIENT OCCUPATIONAL THERAPY ORTHO TREATMENT  Patient Name: Kathryn Owen MRN: [id number] DOB:1945-09-26, 78 y.o., female Today's Date: 10/24/2023  PCP: Dr Ross MART PROVIDER: Dr Kathlynn  END OF SESSION:  OT End of Session - 10/24/23 1237     Visit Number 13    Number of Visits 15    Date for OT Re-Evaluation 11/10/23    OT Start Time 0822    OT Stop Time 0906    OT Time Calculation (min) 44 min    Activity Tolerance Patient tolerated treatment well    Behavior During Therapy WFL for tasks assessed/performed           Past Medical History:  Diagnosis Date   Allergy history unknown    Arthritis    Asthma    Carpal tunnel syndrome    Fatty liver    GERD (gastroesophageal reflux disease)    Herpes zoster    HLD (hyperlipidemia)    mixed   HTN (hypertension)    Hypercalcemia    Hyperparathyroidism    secondary   Insomnia    Osteopenia    Rosacea    Vitamin B deficiency    Vitamin D deficiency    Past Surgical History:  Procedure Laterality Date   CARDIOVASCULAR STRESS TEST     nml    CESAREAN SECTION     x3   Patient Active Problem List   Diagnosis Date Noted   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  They need to help me out of bed in the morning and then getting dressed I cannot put my shoes on and socks.  I wear dresses.  I use a reacher to pick up things from the floor but that irritates my right hand.  My left hand feels better.  And then driving I just cannot do but I had to drive to the physical therapist and also to  you today -see me 2 weeks ago my hands goes numb.  When I do think.  All my fingers on my leg and in the right my index and middle finger   pt accompanied by: self  PERTINENT HISTORY: Assessment/Plan: 07/08/23 Dr Kathlynn note  Assessment & Plan Trigger finger  Bilateral trigger finger affects both hands, causing locking of the middle fingers daily, especially in the morning, for over five months. She cannot tolerate steroid injections due to adverse reactions. Hand therapy and paraffin baths are considered as alternative treatments. Surgery remains an option if conservative measures fail, with minimal risk for complications. She will be referred to hand therapy at [facility] in [place] with therapist Deland. Paraffin bath therapy is considered to increase blood flow and reduce pain. Reassessment is planned in one month to evaluate the effectiveness of hand therapy. If symptoms persist, surgical intervention to release the A1 pulley will be discussed.  Dupuytren's contracture  A fibrous band under the skin suggests Dupuytren's contracture, in addition to trigger finger.  Diabetes mellitus without complications  Diabetes is well-controlled with an A1c of 5.5. Blood sugar levels are managed through diet without  medication. Diagnoses and all orders for this visit:  Trigger middle finger of left hand - Ambulatory Referral to Occupational Therapy  Trigger middle finger of right hand - Ambulatory Referral to Occupational Therapy  Controlled type 2 diabetes mellitus with stage 3 chronic kidney disease, with long-term current use of insulin (CMS/HHS-HCC)   PRECAUTIONS: None    WEIGHT BEARING RESTRICTIONS: No  PAIN:  Are you having pain? 4/10 R 3rd A1pulley and L 1/10 increased pain at the shoulders and back and hips no number provided  FALLS: Has patient fallen in last 6 months? No  LIVING ENVIRONMENT: Lives with: lives alone   PLOF: Prior to December no triggering.   Stiffness in the fingers.  Patient independent doing housework and cooking and laundry.  Takes care of her 34-year-old great grand daughter in the afternoons 1-5.  Drive  PATIENT GOALS: I want to get the trigger fingers better and the locking and the pain so I do not need surgery.  NEXT MD VISIT: 4 to 6 weeks  OBJECTIVE:  Note: Objective measures were completed at Evaluation unless otherwise noted.  HAND DOMINANCE: Right  ADLs: Patient limited with increased triggering as well as pain in gripping activities, turning a doorknob holding objects pulling pushing.  FUNCTIONAL OUTCOME MEASURES: Neck session  UPPER EXTREMITY ROM:     Active ROM Right eval Left eval  Shoulder flexion    Shoulder abduction    Shoulder adduction    Shoulder extension    Shoulder internal rotation    Shoulder external rotation    Elbow flexion    Elbow extension    Wrist flexion 62 72  Wrist extension    Wrist ulnar deviation    Wrist radial deviation    Wrist pronation    Wrist supination    (Blank rows = not tested)  Active ROM Right eval Left eval R/L 09/19/23 PROM  Thumb MCP (0-60)     Thumb IP (0-80)     Thumb Radial abd/add (0-55)      Thumb Palmar abd/add (0-45)      Thumb Opposition to Small Finger      Index MCP (0-90) 85 80    Index PIP (0-100) 90  95   Index DIP (0-70)       Long MCP (0-90) 75  85  90/90  Long PIP (0-100) 90  ext -15  95 et -10  -10 ext coming in 95/L100  Long DIP (0-70)     60/70  Ring MCP (0-90)  85  90   Ring PIP (0-100)  90  95   Ring DIP (0-70)       Little MCP (0-90)  90 90    Little PIP (0-100)  85  95   Little DIP (0-70)       (Blank rows = not tested)   HAND FUNCTION: Grip strength: Right: 30 lbs; Left: 30 lbs, Lateral pinch: Right: 10 lbs, Left: 13 lbs, and 3 point pinch: Right: 9 lbs, Left: 6 lbs 09/19/23 Grip strength: Right: 30 lbs; Left: 38 lbs, Lateral pinch: Right: 11 lbs, Left: 13 lbs, and 3 point pinch: Right: 11 lbs, Left: 8  lbs 09/25/23 Grip strength: Right: 34 lbs; Left: 42 lbs, Lateral pinch: Right: 11 lbs, Left: 13 lbs, and 3 point pinch: Right: 11 lbs, Left: 9 lbs 8/4//25 Grip strength: Right: 18 lbs; Left: 32 lbs, Lateral pinch: Right: 9 lbs, Left: 11 lbs, and 3 point pinch: Right: 9 lbs, Left: 8 lbs COORDINATION:  Limited because of triggering when attempting to bend or flex third digit SENSATION: Denies any sensory issues  EDEMA: A1 pulleys tender in the large  COGNITION: Overall cognitive status: Within functional limits for tasks assessed     TREATMENT DATE: 10/24/23      Patient continues to report increased stiffness and pain in all her joints.  Hard time getting out of bed.  Moving.   Cannot drive and cannot take care of herself in the morning.  Her daughter moved her in with her.  She using a reacher to pick things up from the floor.  Increased pain and swelling in the right hand because of use of reacher.   Patient did see her PCP and blood work did not show rheumatoid or inflammation She had 1 appointment with physical therapy for her shoulders.  Will see them next week again  Patient report numbness in the left hand and all digits with use and right 2nd and 3rd.  Left hand negative for tenderness or Tinel over the carpal tunnel.  Patient report symptoms as upper arm above the elbow.?  Cervical Right hand tender and positive Tinel over the carpal tunnel. Recommend for patient to find her carpal tunnel brace that she used in the past and wearing it at nighttime and some during the day on the right.  Patient tenderness over the A1 pulleys is still a 4/10 at the right third digit fourth starting to bother her.  Left doing really well. Passive range of motion on the left able to touch palm can also do active range of motion and placing hold pain-free and no triggering Right composite flexion 1 finger out of palm.  PROM pain-free but fourth digit starting to be pain-free and stiff. Patient to  continue with her [facility] block splints.  Recommend for her to use her Isotoner glove during the day and not at nighttime.  Fluidotherapy done this date to bilateral hands to decrease pain and stiffness in wrist and digits.  10 minutes.  With increased flexion at end as well as decreased pain  Manual Therapy: Following modalities, pt was seen for manual therapy techniques performed by therapist for soft tissue mobilization and massage use of foam roller over the volar surface of the hands and digits in addition to forearm at home Use of Graston tool #6 performed over palm and for brushing and sweeping over the volar digits to decrease pain, stiffness and promote increased ROM-focusing mostly on third digits  Patient with full PIP extension on bilateral hands on third digits after soft tissue. Patient to focus at home same.  Therapeutic Exercises:  Passive range of motion to [facility] flexion bilateral hands followed by Active assisted range of motion for intrinsic assist. Followed by composite flexion passive range of motion left more than the right. 3rd and 4th on the right pain-free lacking 1-2 finger out of palm Patient requires min verbal and tactile cues for proper form and technique but requiring -moderate assistance with changes in home program as well as doing technique correctly  Patient was educated in adaptive equipment using a sock aid to don her socks in the past.  Patient was using a reacher to don socks and shoes.  Patient was provided with information for a hip kit to assist with lower body dressing.  Ultrasound done pulse 20% at 1.0 intensity at 3.3 MHz over right carpal tunnel as well as third A1 pulley to decrease pain and inflammation at the end of session  Patient  progression limited by increased inflammation and pain in multiple joints.   Recommend for patient to talk to her PCP to get a referral to diabetic counseling with nutritionist/dietitian at [facility] information provided Patient  in the past used to eat frozen gluten-free meals up to about 3 to 4 weeks ago.    PATIENT EDUCATION: Education details: findings of eval and HEP  Person educated: Patient Education method: Explanation, Demonstration, Tactile cues, Verbal cues, and Handouts Education comprehension: verbalized understanding, returned demonstration, verbal cues required, and needs further education    GOALS: Goals reviewed with patient? Yes  LONG TERM GOALS: Target date: 6 wks  Patient be independent home program to increase motion and decrease pain to less than 3/10. Baseline: Patient no knowledge of home program.  Pain and tenderness 8-9/10.  Triggering several times in the clinic and reported during the day. Goal status: Met  2.  Patient's passive range of motion to bilateral third digits improved to touching palm with no increased pain or triggering. Baseline: Third digit right hand MC 75 and PIP 90.  Left third MCP 85 and PIP 95.  But triggered sensation.  Tenderness and pain 8-9/10.  Patient also showing decreased PIP extension in bilateral thirds -10 to -15 degrees NOW patient pain decreased to 0-1/10.  Was able to touch palm with passive range of motion the last week or 2.  But  increase inflammation in her body and pain with movement in multiple joints-digits your PCP and done blood work await results Goal status: Progressing  3.  Patient verbalize 2-3 modifications and adaptations to decrease triggering as well as pain in bilateral hands. Baseline: Patient no knowledge.  Pain is 8-9/10 over bilateral third digits as well as A1 pulley.  Triggering several times during the day as well as in session.   Goal status: Met  4.  Patient's show composite flexion touching palm to use in ADLs and IADLs with less than 2 episodes of triggering a day Baseline: Patient with several times during the day or every time attempting to do composite flexion triggering at bilateral third digits.  Pain 8-9/10 pain.   Decreased range of motion in bilateral digits Goal status: Progressing  5.  Grip and prevention strength improved to within normal limits for her age with no increased pain and symptoms to return to prior level of function Baseline: Grip 30 pounds left and right hand lateral pinch on the right 10 pounds left 13 pounds and 3 point pinch 9 and left 6 pounds Goal status: progressing    ASSESSMENT:  CLINICAL IMPRESSION: Patient seen today for occupational therapy for bilateral third digit trigger fingers.  Patient reports started in December 24.  Patient not able to get any shots because of allergic reactions and diabetic.  Patient pain at eval  8-9/10 over bilateral third digits as well as A1 pulleys.  Patient showing in session several episodes of triggering.  Also report every attempt of composite fist third digits triggering or locking.  With worse in the morning.  Patient with decreased flexion in all digits as well as decreased PIP extension in bilateral third digits with -10 to -15 degrees.  Grip and prehension strength decreased in bilateral hands NOW .  Patient was doing very well with her progress.  But limited the last few weeks with increased pain and inflammation and stiffness in multiple joints and body.  Patient arrives today with left hand maintaining improvement but her right hand worse because of use of reacher to  pick things up from the floor.  Patient moved in with her daughter and that assist her with getting out of bed and getting dressed.  Cannot drive but she did try to go to physical therapy at this appointment driving.  Recommend for patient to see diabetic counselor.  Appear patient was eating for months up to 3-4 weeks ago frozen gluten-free meals.  Patient is left-hand doing better than the right.  With pain composite flexion 1 cm out of palm pain-free.  But patient also reports needs to use a reacher to pick objects off the floor forcing her to use repetitive pinching on the  right.   Educated patient on adaptive equipment to help her get dressed.  Information provided decreased her to 1 time a week to biweekly to continue to monitor for patient to maintain her progress with her flareup and inflammation and pain.  Patient to use her Isotoner glove during the day if causing discomfort at night.  Patient can benefit from skilled OT services to decrease pain and inflammation and increase motion and strength as well as joint protection and modification education, splinting and modalities to return to prior level of function.  PERFORMANCE DEFICITS: in functional skills including ADLs, IADLs, ROM, strength, pain, flexibility, decreased knowledge of use of DME, and UE functional use,   and psychosocial skills including environmental adaptation and routines and behaviors.   IMPAIRMENTS: are limiting patient from ADLs, IADLs, rest and sleep, play, leisure, and social participation.   COMORBIDITIES: has no other co-morbidities that affects occupational performance. Patient will benefit from skilled OT to address above impairments and improve overall function.  MODIFICATION OR ASSISTANCE TO COMPLETE EVALUATION: No modification of tasks or assist necessary to complete an evaluation.  OT OCCUPATIONAL PROFILE AND HISTORY: Problem focused assessment: Including review of records relating to presenting problem.  CLINICAL DECISION MAKING: LOW - limited treatment options, no task modification necessary  REHAB POTENTIAL: Good for goals  EVALUATION COMPLEXITY: Low   PLAN:  OT FREQUENCY: 1-2x/week  OT DURATION: 6 weeks  PLANNED INTERVENTIONS: 97168 OT Re-evaluation, 97535 self care/ADL training, 02889 therapeutic exercise, 97530 therapeutic activity, 97140 manual therapy, 97035 ultrasound, 97018 paraffin, 02960 fluidotherapy, 97034 contrast bath, 97033 iontophoresis, orthotics, joint protection and modifications   CONSULTED AND AGREED WITH PLAN OF CARE: Patient  Ancel Peters,  OTR/L,CLT 10/24/2023, 12:38 PM

## 2023-10-27 ENCOUNTER — Ambulatory Visit: Admitting: Occupational Therapy

## 2023-10-30 ENCOUNTER — Encounter: Admitting: Occupational Therapy

## 2023-10-31 ENCOUNTER — Encounter: Admitting: Occupational Therapy

## 2023-11-04 ENCOUNTER — Ambulatory Visit: Attending: Orthopedic Surgery | Admitting: Occupational Therapy

## 2023-11-04 DIAGNOSIS — M25642 Stiffness of left hand, not elsewhere classified: Secondary | ICD-10-CM | POA: Diagnosis present

## 2023-11-04 DIAGNOSIS — M79641 Pain in right hand: Secondary | ICD-10-CM | POA: Diagnosis present

## 2023-11-04 DIAGNOSIS — M25641 Stiffness of right hand, not elsewhere classified: Secondary | ICD-10-CM | POA: Diagnosis present

## 2023-11-04 DIAGNOSIS — M65331 Trigger finger, right middle finger: Secondary | ICD-10-CM | POA: Diagnosis present

## 2023-11-04 DIAGNOSIS — M65332 Trigger finger, left middle finger: Secondary | ICD-10-CM | POA: Diagnosis present

## 2023-11-04 DIAGNOSIS — M79642 Pain in left hand: Secondary | ICD-10-CM | POA: Diagnosis present

## 2023-11-04 NOTE — Therapy (Signed)
 OUTPATIENT OCCUPATIONAL THERAPY ORTHO TREATMENT  Patient Name: Kathryn Owen MRN: [id number] DOB:01-01-1946, 78 y.o., female Today's Date: 11/04/2023  PCP: Dr Ross MART PROVIDER: Dr Kathlynn  END OF SESSION:  OT End of Session - 11/04/23 1309     Visit Number 14    Number of Visits 15    Date for OT Re-Evaluation 11/10/23    OT Start Time 1309    OT Stop Time 1358    OT Time Calculation (min) 49 min    Activity Tolerance Patient tolerated treatment well    Behavior During Therapy WFL for tasks assessed/performed           Past Medical History:  Diagnosis Date   Allergy history unknown    Arthritis    Asthma    Carpal tunnel syndrome    Fatty liver    GERD (gastroesophageal reflux disease)    Herpes zoster    HLD (hyperlipidemia)    mixed   HTN (hypertension)    Hypercalcemia    Hyperparathyroidism    secondary   Insomnia    Osteopenia    Rosacea    Vitamin B deficiency    Vitamin D deficiency    Past Surgical History:  Procedure Laterality Date   CARDIOVASCULAR STRESS TEST     nml    CESAREAN SECTION     x3   Patient Active Problem List   Diagnosis Date Noted   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  I am still stiff in the mornings but not as bad.  Also more stuff when I am sitting.  My right shoulder is getting better but my left arm not.  She still working on active and physical therapy.  Mild left hand is doing better and no triggering.  Mild right hand just more stiff but I stopped using the reacher.  pt accompanied by:  self  PERTINENT HISTORY: Assessment/Plan: 07/08/23 Dr Kathlynn note  Assessment & Plan Trigger finger  Bilateral trigger finger affects both hands, causing locking of the middle fingers daily, especially in the morning, for over five months. She cannot tolerate steroid injections due to adverse reactions. Hand therapy and paraffin baths are considered as alternative treatments. Surgery remains an option if conservative measures fail, with minimal risk for complications. She will be referred to hand therapy at [facility] in [place] with therapist Deland. Paraffin bath therapy is considered to increase blood flow and reduce pain. Reassessment is planned in one month to evaluate the effectiveness of hand therapy. If symptoms persist, surgical intervention to release the A1 pulley will be discussed.  Dupuytren's contracture  A fibrous band under the skin suggests Dupuytren's contracture, in addition to trigger finger.  Diabetes mellitus without complications  Diabetes is well-controlled with an A1c of 5.5. Blood sugar levels are managed through diet without medication. Diagnoses and all orders for this visit:  Trigger middle finger of left hand - Ambulatory Referral to Occupational Therapy  Trigger middle finger of right hand - Ambulatory Referral to Occupational Therapy  Controlled type 2 diabetes mellitus with stage  3 chronic kidney disease, with long-term current use of insulin (CMS/HHS-HCC)   PRECAUTIONS: None    WEIGHT BEARING RESTRICTIONS: No  PAIN:  Are you having pain? 2/10 R 3rd A1pulley and L 1/10 continues to have increased pain in the left shoulder and upper arm as well as back  FALLS: Has patient fallen in last 6 months? No  LIVING ENVIRONMENT: Lives with: lives alone   PLOF: Prior to December no triggering.  Stiffness in the fingers.  Patient independent doing housework and cooking and laundry.  Takes care of her 64-year-old great grand daughter in the  afternoons 1-5.  Drive  PATIENT GOALS: I want to get the trigger fingers better and the locking and the pain so I do not need surgery.  NEXT MD VISIT: 4 to 6 weeks  OBJECTIVE:  Note: Objective measures were completed at Evaluation unless otherwise noted.  HAND DOMINANCE: Right  ADLs: Patient limited with increased triggering as well as pain in gripping activities, turning a doorknob holding objects pulling pushing.  FUNCTIONAL OUTCOME MEASURES: Neck session  UPPER EXTREMITY ROM:     Active ROM Right eval Left eval  Shoulder flexion    Shoulder abduction    Shoulder adduction    Shoulder extension    Shoulder internal rotation    Shoulder external rotation    Elbow flexion    Elbow extension    Wrist flexion 62 72  Wrist extension    Wrist ulnar deviation    Wrist radial deviation    Wrist pronation    Wrist supination    (Blank rows = not tested)  Active ROM Right eval Left eval R/L 09/19/23 PROM R/L 11/04/23 AROM   Thumb MCP (0-60)      Thumb IP (0-80)      Thumb Radial abd/add (0-55)       Thumb Palmar abd/add (0-45)       Thumb Opposition to Small Finger       Index MCP (0-90) 85 80   80/70  Index PIP (0-100) 90  95  90/90  Index DIP (0-70)        Long MCP (0-90) 75  85  90/90 60/80  Long PIP (0-100) 90  ext -15  95 et -10  -10 ext coming in 95/L100 80/95  Long DIP (0-70)     60/70   Ring MCP (0-90)  85  90  60/80  Ring PIP (0-100)  90  95  75/100  Ring DIP (0-70)        Little MCP (0-90)  90 90   80/90  Little PIP (0-100)  85  95  70/90  Little DIP (0-70)        (Blank rows = not tested)   HAND FUNCTION: Grip strength: Right: 30 lbs; Left: 30 lbs, Lateral pinch: Right: 10 lbs, Left: 13 lbs, and 3 point pinch: Right: 9 lbs, Left: 6 lbs 09/19/23 Grip strength: Right: 30 lbs; Left: 38 lbs, Lateral pinch: Right: 11 lbs, Left: 13 lbs, and 3 point pinch: Right: 11 lbs, Left: 8 lbs 09/25/23 Grip strength: Right: 34 lbs; Left: 42 lbs, Lateral pinch: Right:  11 lbs, Left: 13 lbs, and 3 point pinch: Right: 11 lbs, Left: 9 lbs 8/4//25 Grip strength: Right: 18 lbs; Left: 32 lbs, Lateral pinch: Right: 9 lbs, Left: 11 lbs, and 3 point pinch: Right: 9 lbs, Left: 8 lbs 11/04/23 Grip strength: Right: 12 lbs; Left: 25 lbs, Lateral pinch: Right: 8 lbs, Left: 10 lbs, and  3 point pinch: Right: 7 lbs, Left: 9 lbs COORDINATION: Limited because of triggering when attempting to bend or flex third digit SENSATION: Denies any sensory issues  EDEMA: A1 pulleys tender in the large  COGNITION: Overall cognitive status: Within functional limits for tasks assessed     TREATMENT DATE: 11/04/23      Patient had the last few weeks increase pain in the hips, the back, the shoulders. Patient returns today after reports of her right shoulder improving with physical therapy and still working on the left.  Hips and back starting to feel better. Reinforced with patient again to not go back and eat frozen gluten-free food but eat nonprocessed nonfrozen fruit still recommend for her to talk with her PCP about a referral to the dietitian for diabetic counseling for eating.  Patient report continues numbness with driving and gripping activities as well as at nighttime in the right hand. Patient had carpal tunnel in the past. Patient did get a wrist brace. Recommend for patient to sleep with that in combination with her Isotoner glove and MC block splint. And wear the brace with composite fisting activities.  During the day.   Patient did not have tenderness or positive Tinel over the right carpal tunnel but increased edema and tightness. Increased stiffness in the right digit flexion see flowsheet Increased motion on the left digits Less tenderness of the A1 pulleys on the right 2/10 on the third A1 pulley in the left 1/10. Grip and prehension strength assessed.  See flowsheet.  Decreased    Fluidotherapy done to right hand with 2 cycles of ice to contrast to increase  active range of motion in digits and wrist.  Done paraffin to the left hand to decrease stiffness increase motion 8 minutes each.  Manual Therapy: Following modalities, pt was seen for manual therapy techniques performed by therapist for soft tissue mobilization and massage use of foam roller over the volar surface of the hands and digits in addition to forearm at home Use of Graston tool #6 performed over palm and for brushing and sweeping over the volar digits to decrease pain, stiffness and promote increased ROM-focusing mostly on third digits  Patient with full PIP extension on bilateral hands on third digits after soft tissue. Patient to focus at home same.  Therapeutic Exercises:  Passive range of motion to [facility] flexion bilateral hands followed by Active assisted range of motion for intrinsic assist. Followed by composite flexion passive range of motion left and right touching palm. Followed by placing hold and composite fist. Patient with increased stiffness on the right unable to maintain touching palm. Reviewed with patient again passive range of motion tendon glides with a place and hold.   After contrast at home to do soft tissue mobilization rolling her forearm and palm over the right roller.    Patient progression limited by increased inflammation and pain in multiple joints the last few weeks.   Recommend for patient to talk to her PCP to get a referral to diabetic counseling with nutritionist/dietitian at [facility] information provided Patient in the past used to eat frozen gluten-free meals up to about 3 to 4 weeks ago.    PATIENT EDUCATION: Education details: findings of eval and HEP  Person educated: Patient Education method: Explanation, Demonstration, Tactile cues, Verbal cues, and Handouts Education comprehension: verbalized understanding, returned demonstration, verbal cues required, and needs further education    GOALS: Goals reviewed with patient? Yes  LONG  TERM GOALS: Target date: 6 wks  Patient be independent home program to increase motion and decrease pain to less than 3/10. Baseline: Patient no knowledge of home program.  Pain and tenderness 8-9/10.  Triggering several times in the clinic and reported during the day. Goal status: Met  2.  Patient's passive range of motion to bilateral third digits improved to touching palm with no increased pain or triggering. Baseline: Third digit right hand MC 75 and PIP 90.  Left third MCP 85 and PIP 95.  But triggered sensation.  Tenderness and pain 8-9/10.  Patient also showing decreased PIP extension in bilateral thirds -10 to -15 degrees NOW patient pain decreased to 0-1/10.  Was able to touch palm with passive range of motion the last week or 2.  But  increase inflammation in her body and pain with movement in multiple joints-digits your PCP and done blood work await results Goal status: Progressing  3.  Patient verbalize 2-3 modifications and adaptations to decrease triggering as well as pain in bilateral hands. Baseline: Patient no knowledge.  Pain is 8-9/10 over bilateral third digits as well as A1 pulley.  Triggering several times during the day as well as in session.   Goal status: Met  4.  Patient's show composite flexion touching palm to use in ADLs and IADLs with less than 2 episodes of triggering a day Baseline: Patient with several times during the day or every time attempting to do composite flexion triggering at bilateral third digits.  Pain 8-9/10 pain.  Decreased range of motion in bilateral digits Goal status: Progressing  5.  Grip and prevention strength improved to within normal limits for her age with no increased pain and symptoms to return to prior level of function Baseline: Grip 30 pounds left and right hand lateral pinch on the right 10 pounds left 13 pounds and 3 point pinch 9 and left 6 pounds Goal status: progressing    ASSESSMENT:  CLINICAL IMPRESSION: Patient seen  today for occupational therapy for bilateral third digit trigger fingers.  Patient reports started in December 24.  Patient not able to get any shots because of allergic reactions and diabetic.  Patient pain at eval  8-9/10 over bilateral third digits as well as A1 pulleys.  Patient showing in session several episodes of triggering.  Also report every attempt of composite fist third digits triggering or locking.  With worse in the morning.  Patient with decreased flexion in all digits as well as decreased PIP extension in bilateral third digits with -10 to -15 degrees.  Grip and prehension strength decreased in bilateral hands NOW .  Patient was doing very well with her progress.  But limited the last few weeks with increased pain and inflammation and stiffness in multiple joints and body.  Patient arrives today with reports of improvement in her right shoulder pain and mobility.  Patient still continues physical therapy for her shoulders.  Patient moved back to her house.  Patient states arrives with left hand able to touch palm for composite flexion with no triggering.  But unable to do a tight composite flexion.  Tenderness 1/10.  Right increase stiffness in digits.  Able to do passive range of motion touching palm but unable to maintain.  Tenderness to 4-10 over the A1 pulley.  Patient's grip) strength decreased greatly the last few weeks with her flareup of inflammation and pain in the body.  Recommend for patient to see diabetic counselor.  Appear patient was eating for months up to 3-4 weeks ago frozen  gluten-free meals.  Decreased her to 1 time a week to biweekly the last few weeks to continue to monitor for patient to maintain her progress with her flareup and inflammation and pain.  Patient to use her Isotoner glove during the day if causing discomfort at night.  Will continue to monitor to see if need to increase range of motion and strength.   Patient can benefit from skilled OT services to decrease pain  and inflammation and increase motion and strength as well as joint protection and modification education, splinting and modalities to return to prior level of function.  PERFORMANCE DEFICITS: in functional skills including ADLs, IADLs, ROM, strength, pain, flexibility, decreased knowledge of use of DME, and UE functional use,   and psychosocial skills including environmental adaptation and routines and behaviors.   IMPAIRMENTS: are limiting patient from ADLs, IADLs, rest and sleep, play, leisure, and social participation.   COMORBIDITIES: has no other co-morbidities that affects occupational performance. Patient will benefit from skilled OT to address above impairments and improve overall function.  MODIFICATION OR ASSISTANCE TO COMPLETE EVALUATION: No modification of tasks or assist necessary to complete an evaluation.  OT OCCUPATIONAL PROFILE AND HISTORY: Problem focused assessment: Including review of records relating to presenting problem.  CLINICAL DECISION MAKING: LOW - limited treatment options, no task modification necessary  REHAB POTENTIAL: Good for goals  EVALUATION COMPLEXITY: Low   PLAN:  OT FREQUENCY: 1-2x/week  OT DURATION: 6 weeks  PLANNED INTERVENTIONS: 97168 OT Re-evaluation, 97535 self care/ADL training, 02889 therapeutic exercise, 97530 therapeutic activity, 97140 manual therapy, 97035 ultrasound, 97018 paraffin, 02960 fluidotherapy, 97034 contrast bath, 97033 iontophoresis, orthotics, joint protection and modifications   CONSULTED AND AGREED WITH PLAN OF CARE: Patient  Ancel Peters, OTR/L,CLT 11/04/2023, 2:00 PM

## 2023-11-07 ENCOUNTER — Ambulatory Visit: Admitting: Occupational Therapy

## 2023-11-07 DIAGNOSIS — M65331 Trigger finger, right middle finger: Secondary | ICD-10-CM | POA: Diagnosis not present

## 2023-11-07 DIAGNOSIS — M25641 Stiffness of right hand, not elsewhere classified: Secondary | ICD-10-CM

## 2023-11-07 DIAGNOSIS — M79642 Pain in left hand: Secondary | ICD-10-CM

## 2023-11-07 DIAGNOSIS — M79641 Pain in right hand: Secondary | ICD-10-CM

## 2023-11-07 NOTE — Therapy (Signed)
 OUTPATIENT OCCUPATIONAL THERAPY ORTHO TREATMENT/RECERT  Patient Name: Kathryn Owen MRN: [id number] DOB:December 12, 1945, 78 y.o., female Today's Date: 11/07/2023  PCP: Dr Ross MART PROVIDER: Dr Kathlynn  END OF SESSION:  OT End of Session - 11/07/23 0900     Visit Number 15    Number of Visits 21    Date for OT Re-Evaluation 12/19/23    OT Start Time 0900    OT Stop Time 0947    OT Time Calculation (min) 47 min    Activity Tolerance Patient tolerated treatment well    Behavior During Therapy WFL for tasks assessed/performed            Past Medical History:  Diagnosis Date   Allergy history unknown    Arthritis    Asthma    Carpal tunnel syndrome    Fatty liver    GERD (gastroesophageal reflux disease)    Herpes zoster    HLD (hyperlipidemia)    mixed   HTN (hypertension)    Hypercalcemia    Hyperparathyroidism    secondary   Insomnia    Osteopenia    Rosacea    Vitamin B deficiency    Vitamin D deficiency    Past Surgical History:  Procedure Laterality Date   CARDIOVASCULAR STRESS TEST     nml    CESAREAN SECTION     x3   Patient Active Problem List   Diagnosis Date Noted   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  I was doing back a few weeks.  Yes had to lift with my daughter I still cannot drive.  But my shoulders are starting to get better my hips is starting to get better I can move a little bit better.  My left hand has been doing good no triggering just stiff.  But my right hand got worse after I had to use that reacher to help me  pick up stuff.   Pt accompanied by: self  PERTINENT HISTORY: Assessment/Plan: 07/08/23 Dr Kathlynn note  Assessment & Plan Trigger finger  Bilateral trigger finger affects both hands, causing locking of the middle fingers daily, especially in the morning, for over five months. She cannot tolerate steroid injections due to adverse reactions. Hand therapy and paraffin baths are considered as alternative treatments. Surgery remains an option if conservative measures fail, with minimal risk for complications. She will be referred to hand therapy at [facility] in [place] with therapist Deland. Paraffin bath therapy is considered to increase blood flow and reduce pain. Reassessment is planned in one month to evaluate the effectiveness of hand therapy. If symptoms persist, surgical intervention to release the A1 pulley will be discussed.  Dupuytren's contracture  A fibrous band under the skin suggests Dupuytren's contracture, in addition to trigger finger.  Diabetes mellitus without complications  Diabetes is well-controlled with an A1c of 5.5. Blood sugar levels are managed through diet without medication. Diagnoses and all orders for this visit:  Trigger middle finger of left hand - Ambulatory Referral to Occupational Therapy  Trigger middle finger of right hand -  Ambulatory Referral to Occupational Therapy  Controlled type 2 diabetes mellitus with stage 3 chronic kidney disease, with long-term current use of insulin (CMS/HHS-HCC)   PRECAUTIONS: None    WEIGHT BEARING RESTRICTIONS: No  PAIN:  Are you having pain?  4/10 right third A1 pulley tenderness.  1/10 left third A1 pulley  FALLS: Has patient fallen in last 6 months? No  LIVING ENVIRONMENT: Lives with: lives alone   PLOF: Prior to December no triggering.  Stiffness in the fingers.  Patient independent doing housework and cooking and laundry.  Takes care of her 20-year-old great grand daughter in the afternoons  1-5.  Drive  PATIENT GOALS: I want to get the trigger fingers better and the locking and the pain so I do not need surgery.  NEXT MD VISIT: 4 to 6 weeks  OBJECTIVE:  Note: Objective measures were completed at Evaluation unless otherwise noted.  HAND DOMINANCE: Right  ADLs: Patient limited with increased triggering as well as pain in gripping activities, turning a doorknob holding objects pulling pushing.  FUNCTIONAL OUTCOME MEASURES: Neck session  UPPER EXTREMITY ROM:     Active ROM Right eval Left eval  Shoulder flexion    Shoulder abduction    Shoulder adduction    Shoulder extension    Shoulder internal rotation    Shoulder external rotation    Elbow flexion    Elbow extension    Wrist flexion 62 72  Wrist extension    Wrist ulnar deviation    Wrist radial deviation    Wrist pronation    Wrist supination    (Blank rows = not tested)  Active ROM Right eval Left eval R/L 09/19/23 PROM R/L 11/04/23 AROM   Thumb MCP (0-60)      Thumb IP (0-80)      Thumb Radial abd/add (0-55)       Thumb Palmar abd/add (0-45)       Thumb Opposition to Small Finger       Index MCP (0-90) 85 80   80/70  Index PIP (0-100) 90  95  90/90  Index DIP (0-70)        Long MCP (0-90) 75  85  90/90 60/80  Long PIP (0-100) 90  ext -15  95 et -10  -10 ext coming in 95/L100 80/95  Long DIP (0-70)     60/70   Ring MCP (0-90)  85  90  60/80  Ring PIP (0-100)  90  95  75/100  Ring DIP (0-70)        Little MCP (0-90)  90 90   80/90  Little PIP (0-100)  85  95  70/90  Little DIP (0-70)        (Blank rows = not tested)   HAND FUNCTION: Grip strength: Right: 30 lbs; Left: 30 lbs, Lateral pinch: Right: 10 lbs, Left: 13 lbs, and 3 point pinch: Right: 9 lbs, Left: 6 lbs 09/19/23 Grip strength: Right: 30 lbs; Left: 38 lbs, Lateral pinch: Right: 11 lbs, Left: 13 lbs, and 3 point pinch: Right: 11 lbs, Left: 8 lbs 09/25/23 Grip strength: Right: 34 lbs; Left: 42 lbs, Lateral pinch: Right: 11 lbs,  Left: 13 lbs, and 3 point pinch: Right: 11 lbs, Left: 9 lbs 8/4//25 Grip strength: Right: 18 lbs; Left: 32 lbs, Lateral pinch: Right: 9 lbs, Left: 11 lbs, and 3 point pinch: Right: 9 lbs, Left: 8 lbs 11/04/23 Grip strength: Right: 12 lbs; Left: 25 lbs, Lateral pinch: Right: 8 lbs, Left:  10 lbs, and 3 point pinch: Right: 7 lbs, Left: 9 lbs 11/07/23 Grip strength: Right: 15 lbs; Left: 30 lbs, Lateral pinch: Right: 8 lbs, Left: 10 lbs, and 3 point pinch: Right: 7 lbs, Left: 9 lbs COORDINATION: Limited because of triggering when attempting to bend or flex third digit SENSATION: Denies any sensory issues  EDEMA: A1 pulleys tender in the large  COGNITION: Overall cognitive status: Within functional limits for tasks assessed     TREATMENT DATE: 11/07/23      Patient had the last few weeks increase pain in the hips, the back, the shoulders. Patient returns today after reports of her right shoulder improving with physical therapy and still working on the left.  Hips and back starting to feel better. Reinforced with patient again to not go back and eat frozen gluten-free food but eat nonprocessed nonfrozen fruit still recommend for her to talk with her PCP about a referral to the dietitian for diabetic counseling for eating.  Patient report continues numbness with driving and gripping activities but numbness at nighttime improved with wearing wrist braces.   Patient had carpal tunnel in the past per patient Recommend for patient to continue to sleep with the the wrist braces that in combination with her Isotoner glove and MC block splint. And wear the brace with composite fisting activities.  During the day.   This state no tenderness or positive Tinel over the right carpal tunnel but increased edema and tightness. Increased stiffness in the right digits flexion see flowsheet Increased motion on the left digits Less tenderness of the A1 pulleys on the right 4/10 on the third A1 pulley in the left  1/10. Grip and prehension strength assessed.  See flowsheet.  Increasing again after patient had a flareup of inflammation system wide    Fluidotherapy done to right hand with 2 cycles of ice to contrast to increase active range of motion in digits and wrist.  Done paraffin to the left hand to decrease stiffness increase motion 8 minutes each.  Manual Therapy: Following modalities, pt was seen for manual therapy techniques performed by therapist for soft tissue mobilization and massage use of foam roller over the volar surface of the hands and digits in addition to forearm at home Use of Graston tool #6 performed over palm and for brushing and sweeping over the volar digits to decrease pain, stiffness and promote increased ROM-focusing mostly on third digits  Patient with full PIP extension on bilateral hands on third digits after soft tissue. Patient to focus at home same.  Therapeutic Exercises:  Passive range of motion to [facility] flexion bilateral hands followed by Active assisted range of motion for intrinsic assist. Followed by composite flexion passive range of motion left and right touching palm. Followed by placing hold and composite fist. Patient with increased stiffness on the right unable to maintain touching palm. Reviewed with patient again passive range of motion tendon glides with a place and hold.   After contrast at home to do soft tissue mobilization rolling her forearm and palm over the right roller.  Patient did bring both her custom MC block splints in to replace Velcro as well as moleskin.  To increase comfort and decrease composite flexion with gripping activities to decrease pain over A1 pulleys of bilateral third.  Patient progression limited by increased inflammation and pain in multiple joints the last few weeks.   Recommend for patient to talk to her PCP to get a referral to diabetic counseling with nutritionist/dietitian  at [facility] information provided Patient in  the past used to eat frozen gluten-free meals up to about 3 to 4 weeks ago.    PATIENT EDUCATION: Education details: findings of eval and HEP  Person educated: Patient Education method: Explanation, Demonstration, Tactile cues, Verbal cues, and Handouts Education comprehension: verbalized understanding, returned demonstration, verbal cues required, and needs further education    GOALS: Goals reviewed with patient? Yes  LONG TERM GOALS: Target date: 6 wks  Patient be independent home program to increase motion and decrease pain to less than 3/10. Baseline: Patient no knowledge of home program.  Pain and tenderness 8-9/10.  Triggering several times in the clinic and reported during the day. Goal status: Met  2.  Patient's passive range of motion to bilateral third digits improved to touching palm with no increased pain or triggering. Baseline: Third digit right hand MC 75 and PIP 90.  Left third MCP 85 and PIP 95.  But triggered sensation.  Tenderness and pain 8-9/10.  Patient also showing decreased PIP extension in bilateral thirds -10 to -15 degrees NOW patient pain decreased to 0-1/10.  Was able to touch palm with passive range of motion the last week or 2.  But  increase inflammation in her body and pain with movement in multiple joints-digits your PCP and done blood work await results Goal status: Progressing  3.  Patient verbalize 2-3 modifications and adaptations to decrease triggering as well as pain in bilateral hands. Baseline: Patient no knowledge.  Pain is 8-9/10 over bilateral third digits as well as A1 pulley.  Triggering several times during the day as well as in session.   Goal status: Met  4.  Patient's show composite flexion touching palm to use in ADLs and IADLs with less than 2 episodes of triggering a day Baseline: Patient with several times during the day or every time attempting to do composite flexion triggering at bilateral third digits.  Pain 8-9/10 pain.   Decreased range of motion in bilateral digits Goal status: Progressing  5.  Grip and prevention strength improved to within normal limits for her age with no increased pain and symptoms to return to prior level of function Baseline: Grip 30 pounds left and right hand lateral pinch on the right 10 pounds left 13 pounds and 3 point pinch 9 and left 6 pounds Goal status: progressing    ASSESSMENT:  CLINICAL IMPRESSION: Patient seen today for occupational therapy for bilateral third digit trigger fingers.  Patient reports started in December 24.  Patient not able to get any shots because of allergic reactions and diabetic.  Patient pain at eval  8-9/10 over bilateral third digits as well as A1 pulleys.  Patient showing in session several episodes of triggering.  Also report every attempt of composite fist third digits triggering or locking.  With worse in the morning.  Patient with decreased flexion in all digits as well as decreased PIP extension in bilateral third digits with -10 to -15 degrees.  Grip and prehension strength decreased in bilateral hands NOW .  Patient was doing very well with her progress.  But limited the last few weeks with increased pain and inflammation and stiffness in multiple joints and body.  Patient arrives  this week with reports of improvement in her right shoulder pain and mobility.  Patient still continues physical therapy for her shoulders.  Patient moved back to her house.  Patient states arrives with left hand able to touch palm for composite flexion with no  triggering.  But unable to do a tight composite flexion.  Tenderness 1/10 on the left A1 pulley third.  Right increase stiffness in digits.  Able to do passive range of motion touching palm but unable to maintain.  Tenderness to 4-10 over the A1 pulley on the right third.  Patient's grip and prehension  strength decreased greatly the last few weeks with her flareup of inflammation and pain in the body.  Recommend for  patient to see diabetic counselor.  Appear patient was eating for months up to 3-4 weeks ago frozen gluten-free meals.  Decreased her to 1 time a week but for 6 weeks to continue to decrease pain and increased motion and strength.   Patient can benefit from skilled OT services to decrease pain and inflammation and increase motion and strength as well as joint protection and modification education, splinting and modalities to return to prior level of function.  PERFORMANCE DEFICITS: in functional skills including ADLs, IADLs, ROM, strength, pain, flexibility, decreased knowledge of use of DME, and UE functional use,   and psychosocial skills including environmental adaptation and routines and behaviors.   IMPAIRMENTS: are limiting patient from ADLs, IADLs, rest and sleep, play, leisure, and social participation.   COMORBIDITIES: has no other co-morbidities that affects occupational performance. Patient will benefit from skilled OT to address above impairments and improve overall function.  MODIFICATION OR ASSISTANCE TO COMPLETE EVALUATION: No modification of tasks or assist necessary to complete an evaluation.  OT OCCUPATIONAL PROFILE AND HISTORY: Problem focused assessment: Including review of records relating to presenting problem.  CLINICAL DECISION MAKING: LOW - limited treatment options, no task modification necessary  REHAB POTENTIAL: Good for goals  EVALUATION COMPLEXITY: Low   PLAN:  OT FREQUENCY: 1 time a week  OT DURATION: 6 weeks  PLANNED INTERVENTIONS: 97168 OT Re-evaluation, 97535 self care/ADL training, 02889 therapeutic exercise, 97530 therapeutic activity, 97140 manual therapy, 97035 ultrasound, 97018 paraffin, 02960 fluidotherapy, 97034 contrast bath, 97033 iontophoresis, orthotics, joint protection and modifications   CONSULTED AND AGREED WITH PLAN OF CARE: Patient  Ancel Peters, OTR/L,CLT 11/07/2023, 12:55 PM

## 2023-11-21 ENCOUNTER — Ambulatory Visit: Admitting: Occupational Therapy

## 2023-11-21 DIAGNOSIS — M79641 Pain in right hand: Secondary | ICD-10-CM

## 2023-11-21 DIAGNOSIS — M25641 Stiffness of right hand, not elsewhere classified: Secondary | ICD-10-CM

## 2023-11-21 DIAGNOSIS — M65331 Trigger finger, right middle finger: Secondary | ICD-10-CM

## 2023-11-21 DIAGNOSIS — M79642 Pain in left hand: Secondary | ICD-10-CM

## 2023-11-21 NOTE — Therapy (Signed)
 OUTPATIENT OCCUPATIONAL THERAPY ORTHO TREATMENT  Patient Name: Kathryn Owen MRN: [id number] DOB:February 20, 1946, 78 y.o., female Today's Date: 11/21/2023  PCP: Dr Ross MART PROVIDER: Dr Kathlynn  END OF SESSION:  OT End of Session - 11/21/23 0956     Visit Number 16    Number of Visits 21    Date for Recertification  12/19/23    OT Start Time 0951    OT Stop Time 1035    OT Time Calculation (min) 44 min    Activity Tolerance Patient tolerated treatment well    Behavior During Therapy WFL for tasks assessed/performed            Past Medical History:  Diagnosis Date   Allergy history unknown    Arthritis    Asthma    Carpal tunnel syndrome    Fatty liver    GERD (gastroesophageal reflux disease)    Herpes zoster    HLD (hyperlipidemia)    mixed   HTN (hypertension)    Hypercalcemia    Hyperparathyroidism    secondary   Insomnia    Osteopenia    Rosacea    Vitamin B deficiency    Vitamin D deficiency    Past Surgical History:  Procedure Laterality Date   CARDIOVASCULAR STRESS TEST     nml    CESAREAN SECTION     x3   Patient Active Problem List   Diagnosis Date Noted   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   HYPERLIPIDEMIA-MIXED 01/10/2009   ESSENTIAL HYPERTENSION, BENIGN 01/10/2009    ONSET DATE: Dec 24  REFERRING DIAG: bilateral 3rd digits trigger fingers   THERAPY DIAG:  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  My shoulders are starting to feel better, the left still a problem.  My hips is starting to feel better and my back.  I moved back to my own house and I drove for the first time this morning.  My left hand has been doing good no triggering just stif stiff the middle finger -  But my right hand still swollen and cannot make fist - stiff  - that is the one I had to use the reacher with when I did not feel well- I stopped eating those frozen meals like you told me.   Pt accompanied by: self  PERTINENT HISTORY: Assessment/Plan: 07/08/23 Dr Kathlynn note  Assessment & Plan Trigger finger  Bilateral trigger finger affects both hands, causing locking of the middle fingers daily, especially in the morning, for over five months. She cannot tolerate steroid injections due to adverse reactions. Hand therapy and paraffin baths are considered as alternative treatments. Surgery remains an option if conservative measures fail, with minimal risk for complications. She will be referred to hand therapy at [facility] in [place] with therapist Deland. Paraffin bath therapy is considered to increase blood flow and reduce pain. Reassessment is planned in one month to evaluate the effectiveness of hand therapy. If symptoms persist, surgical intervention to release the A1 pulley will be discussed.  Dupuytren's contracture  A fibrous band under the skin suggests Dupuytren's contracture, in addition to trigger finger.  Diabetes mellitus without complications  Diabetes is well-controlled with an A1c of 5.5. Blood sugar levels are managed through diet without medication. Diagnoses and all orders  for this visit:  Trigger middle finger of left hand - Ambulatory Referral to Occupational Therapy  Trigger middle finger of right hand - Ambulatory Referral to Occupational Therapy  Controlled type 2 diabetes mellitus with stage 3 chronic kidney disease, with long-term current use of insulin (CMS/HHS-HCC)   PRECAUTIONS: None    WEIGHT BEARING RESTRICTIONS: No  PAIN:  Are you having pain?  4/10 right third A1 pulley tenderness.  1/10 left third A1 pulley  FALLS: Has patient fallen in last 6 months? No  LIVING ENVIRONMENT: Lives with: lives alone   PLOF: Prior to December no triggering.  Stiffness in the fingers.  Patient  independent doing housework and cooking and laundry.  Takes care of her 73-year-old great grand daughter in the afternoons 1-5.  Drive  PATIENT GOALS: I want to get the trigger fingers better and the locking and the pain so I do not need surgery.  NEXT MD VISIT: 4 to 6 weeks  OBJECTIVE:  Note: Objective measures were completed at Evaluation unless otherwise noted.  HAND DOMINANCE: Right  ADLs: Patient limited with increased triggering as well as pain in gripping activities, turning a doorknob holding objects pulling pushing.  FUNCTIONAL OUTCOME MEASURES: Neck session  UPPER EXTREMITY ROM:     Active ROM Right eval Left eval  Shoulder flexion    Shoulder abduction    Shoulder adduction    Shoulder extension    Shoulder internal rotation    Shoulder external rotation    Elbow flexion    Elbow extension    Wrist flexion 62 72  Wrist extension    Wrist ulnar deviation    Wrist radial deviation    Wrist pronation    Wrist supination    (Blank rows = not tested)  Active ROM Right eval Left eval R/L 09/19/23 PROM R/L 11/04/23 AROM  R/L 11/21/23  Thumb MCP (0-60)       Thumb IP (0-80)       Thumb Radial abd/add (0-55)        Thumb Palmar abd/add (0-45)        Thumb Opposition to Small Finger        Index MCP (0-90) 85 80   80/70 70/70  Index PIP (0-100) 90  95  90/90 90/90  Index DIP (0-70)         Long MCP (0-90) 75  85  90/90 60/80 60/80   Long PIP (0-100) 90  ext -15  95 et -10  -10 ext coming in 95/L100 80/95 80/90  Long DIP (0-70)     60/70    Ring MCP (0-90)  85  90  60/80 75/85  Ring PIP (0-100)  90  95  75/100 90/100  Ring DIP (0-70)         Little MCP (0-90)  90 90   80/90 80/90  Little PIP (0-100)  85  95  70/90 90/90  Little DIP (0-70)         (Blank rows = not tested)   HAND FUNCTION: Grip strength: Right: 30 lbs; Left: 30 lbs, Lateral pinch: Right: 10 lbs, Left: 13 lbs, and 3 point pinch: Right: 9 lbs, Left: 6 lbs 09/19/23 Grip strength: Right: 30  lbs; Left: 38 lbs, Lateral pinch: Right: 11 lbs, Left: 13 lbs, and 3 point pinch: Right: 11 lbs, Left: 8 lbs 09/25/23 Grip strength: Right: 34 lbs; Left: 42 lbs, Lateral pinch: Right: 11 lbs, Left: 13 lbs, and 3 point pinch: Right: 11 lbs, Left: 9  lbs 8/4//25 Grip strength: Right: 18 lbs; Left: 32 lbs, Lateral pinch: Right: 9 lbs, Left: 11 lbs, and 3 point pinch: Right: 9 lbs, Left: 8 lbs 11/04/23 Grip strength: Right: 12 lbs; Left: 25 lbs, Lateral pinch: Right: 8 lbs, Left: 10 lbs, and 3 point pinch: Right: 7 lbs, Left: 9 lbs 11/07/23 Grip strength: Right: 15 lbs; Left: 30 lbs, Lateral pinch: Right: 8 lbs, Left: 10 lbs, and 3 point pinch: Right: 7 lbs, Left: 9 lbs COORDINATION: Limited because of triggering when attempting to bend or flex third digit SENSATION: Denies any sensory issues  EDEMA: A1 pulleys tender in the large  COGNITION: Overall cognitive status: Within functional limits for tasks assessed     TREATMENT DATE: 11/21/23     Patient made initially great progress in bilateral third digit trigger fingers. But then had an increase of inflammation and several body parts with increased pain in bilateral shoulders and hips and back. Patient was not able to lift herself had to move in with her family as well as could not drive. Did had blood work.  But unsure about results.  Patient is questioning one of the lab works. Patient has multiple allergies to medications and foods and excetra. Appear also prior to that patient was eating for a few weeks a gluten-free frozen foods daily. Reinforced multiple times for patient to talk with her PCP about a referral to diabetic counselor dietitian that is at [facility].  Patient r regressed over that time.  But return with great progress again in the left hand and third digit trigger finger the last 2 sessions. But patient continues to have increased carpal tunnel symptoms in bilateral hands.  Patient reports she had carpal tunnel in the  past. Also with increased edema increase swelling and stiffness in the right hand. Especially at the third finger.  Patient report continues numbness with driving and gripping activities but numbness at nighttime improved with wearing wrist braces.   Patient had carpal tunnel in the past per patient Recommend for patient to continue to sleep with the the wrist braces that in combination with her Isotoner glove and MC block splint on the right third digit And wear the brace with composite fisting activities.  During the day.    Less tenderness of the A1 pulleys on the right 4/10 on the third A1 pulley in the left 1/10. Grip and prehension strength assessed.  See flowsheet.  Increasing again after patient had a flareup of inflammation system wide    Manual Therapy: Following modalities, pt was seen for manual therapy techniques performed by therapist for soft tissue mobilization and massage use of foam roller over the volar surface of the hands and digits in addition to forearm at home Use of Graston tool #6 performed over palm and for brushing and sweeping over the volar digits to decrease pain, stiffness and promote increased ROM-focusing mostly on third digits  Patient with full PIP extension on bilateral hands on third digits after soft tissue. Patient to focus at home same.  Therapeutic Exercises:  Passive range of motion to [facility] flexion bilateral hands followed by Active assisted range of motion for intrinsic assist. Followed by composite flexion passive range of motion left and right touching palm. Followed by placing hold and composite fist. Patient with increased stiffness on the right unable to maintain touching palm. Reviewed with patient again passive range of motion tendon glides with a place and hold.   Did reach out to Dr. Kathlynn if  OT can refer patient to hand surgeon. Agree.   PATIENT EDUCATION: Education details: findings of eval and HEP  Person educated:  Patient Education method: Explanation, Demonstration, Tactile cues, Verbal cues, and Handouts Education comprehension: verbalized understanding, returned demonstration, verbal cues required, and needs further education    GOALS: Goals reviewed with patient? Yes  LONG TERM GOALS: Target date: 6 wks  Patient be independent home program to increase motion and decrease pain to less than 3/10. Baseline: Patient no knowledge of home program.  Pain and tenderness 8-9/10.  Triggering several times in the clinic and reported during the day. Goal status: Met  2.  Patient's passive range of motion to bilateral third digits improved to touching palm with no increased pain or triggering. Baseline: Third digit right hand MC 75 and PIP 90.  Left third MCP 85 and PIP 95.  But triggered sensation.  Tenderness and pain 8-9/10.  Patient also showing decreased PIP extension in bilateral thirds -10 to -15 degrees NOW patient pain decreased to 0-1/10.  Was able to touch palm with passive range of motion the last week or 2.  But  increase inflammation in her body and pain with movement in multiple joints-digits your PCP and done blood work await results Goal status: Progressing  3.  Patient verbalize 2-3 modifications and adaptations to decrease triggering as well as pain in bilateral hands. Baseline: Patient no knowledge.  Pain is 8-9/10 over bilateral third digits as well as A1 pulley.  Triggering several times during the day as well as in session.   Goal status: Met  4.  Patient's show composite flexion touching palm to use in ADLs and IADLs with less than 2 episodes of triggering a day Baseline: Patient with several times during the day or every time attempting to do composite flexion triggering at bilateral third digits.  Pain 8-9/10 pain.  Decreased range of motion in bilateral digits Goal status: Progressing  5.  Grip and prevention strength improved to within normal limits for her age with no increased  pain and symptoms to return to prior level of function Baseline: Grip 30 pounds left and right hand lateral pinch on the right 10 pounds left 13 pounds and 3 point pinch 9 and left 6 pounds Goal status: progressing    ASSESSMENT:  CLINICAL IMPRESSION: Patient was referred to occupational therapy for bilateral third digit trigger fingers.  Patient reports started in December 24.  Patient not able to get any shots because of allergic reactions and diabetic.  Patient pain at eval  8-9/10 over bilateral third digits as well as A1 pulleys.  Patient showing in session several episodes of triggering.  Also report every attempt of composite fist third digits triggering or locking.  With worse in the morning.  Patient with decreased flexion in all digits as well as decreased PIP extension in bilateral third digits with -10 to -15 degrees.  Grip and prehension strength decreased in bilateral hands NOW .  Patient was doing very well with her progress.  But limited the last few weeks with increased pain and inflammation and stiffness in multiple joints and body.  Patient arrives the last couple of sessions with reports of improvement in her right shoulder pain and mobility.  Patient still continues physical therapy for her shoulders.  Patient moved back to her house.  And also drove for the first time today.  Patient arrives with left hand able to touch palm for composite flexion with no triggering.  But unable to  do a tight composite flexion.  Tenderness 1/10 on the left A1 pulley third.  Right increase stiffness in digits.  Able to do passive range of motion touching palm but unable to maintain.  Tenderness to 4/10 over the A1 pulley on the right third.  Patient's grip and prehension  strength decreased greatly the last few weeks with her flareup of inflammation and pain in the body.  Recommend for patient to see diabetic counselor.  Appear patient was eating for months up to 3-4 weeks ago frozen gluten-free meals.   Patient also with carpal tunnel symptoms in bilateral hands.  With the trigger fingers attempted 1 time to do iontophoresis with dexamethasone and patient feels she was allergic to it.  Told her to discuss with her PCP to get that on allergy list.  Did reach out to Dr. Kathlynn if OT can refer patient to hand surgeon for assessment and recommendations.  Agree.  Patient has appointment with hand surgeon next week.  Patient can benefit from skilled OT services to decrease pain and inflammation and increase motion and strength as well as joint protection and modification education, splinting and modalities to return to prior level of function.  PERFORMANCE DEFICITS: in functional skills including ADLs, IADLs, ROM, strength, pain, flexibility, decreased knowledge of use of DME, and UE functional use,   and psychosocial skills including environmental adaptation and routines and behaviors.   IMPAIRMENTS: are limiting patient from ADLs, IADLs, rest and sleep, play, leisure, and social participation.   COMORBIDITIES: has no other co-morbidities that affects occupational performance. Patient will benefit from skilled OT to address above impairments and improve overall function.  MODIFICATION OR ASSISTANCE TO COMPLETE EVALUATION: No modification of tasks or assist necessary to complete an evaluation.  OT OCCUPATIONAL PROFILE AND HISTORY: Problem focused assessment: Including review of records relating to presenting problem.  CLINICAL DECISION MAKING: LOW - limited treatment options, no task modification necessary  REHAB POTENTIAL: Good for goals  EVALUATION COMPLEXITY: Low   PLAN:  OT FREQUENCY: 1 time a week  OT DURATION: 6 weeks  PLANNED INTERVENTIONS: 97168 OT Re-evaluation, 97535 self care/ADL training, 02889 therapeutic exercise, 97530 therapeutic activity, 97140 manual therapy, 97035 ultrasound, 97018 paraffin, 02960 fluidotherapy, 97034 contrast bath, 97033 iontophoresis, orthotics, joint  protection and modifications   CONSULTED AND AGREED WITH PLAN OF CARE: Patient  Ancel Peters, OTR/L,CLT 11/21/2023, 12:26 PM

## 2023-11-28 ENCOUNTER — Ambulatory Visit: Admitting: Occupational Therapy

## 2023-11-28 DIAGNOSIS — M65331 Trigger finger, right middle finger: Secondary | ICD-10-CM | POA: Diagnosis not present

## 2023-11-28 DIAGNOSIS — M25641 Stiffness of right hand, not elsewhere classified: Secondary | ICD-10-CM

## 2023-11-28 DIAGNOSIS — M65332 Trigger finger, left middle finger: Secondary | ICD-10-CM

## 2023-11-28 DIAGNOSIS — M79642 Pain in left hand: Secondary | ICD-10-CM

## 2023-11-28 DIAGNOSIS — M79641 Pain in right hand: Secondary | ICD-10-CM

## 2023-11-28 NOTE — Therapy (Signed)
 OUTPATIENT OCCUPATIONAL THERAPY ORTHO TREATMENT  Patient Name: Kathryn Owen MRN: [id number] DOB:1945-12-30, 78 y.o., female Today's Date: 11/28/2023  PCP: Dr Ross MART PROVIDER: Dr Kathlynn  END OF SESSION:  OT End of Session - 11/28/23 0951     Visit Number 17    Number of Visits 21    Date for Recertification  12/19/23    OT Start Time 0948    OT Stop Time 1035    OT Time Calculation (min) 47 min    Activity Tolerance Patient tolerated treatment well    Behavior During Therapy WFL for tasks assessed/performed            Past Medical History:  Diagnosis Date   Allergy history unknown    Arthritis    Asthma    Carpal tunnel syndrome    Fatty liver    GERD (gastroesophageal reflux disease)    Herpes zoster    HLD (hyperlipidemia)    mixed   HTN (hypertension)    Hypercalcemia    Hyperparathyroidism    secondary   Insomnia    Osteopenia    Rosacea    Vitamin B deficiency    Vitamin D deficiency    Past Surgical History:  Procedure Laterality Date   CARDIOVASCULAR STRESS TEST     nml    CESAREAN SECTION     x3   Patient Active Problem List   Diagnosis Date Noted   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  But we do not try 4 more weeks of therapy. I do want to avoid surgery.  And then also may be nerve conduction test we talked about from a carpal tunnel.  Pt accompanied by: self  PERTINENT HISTORY: Assessment/Plan: 07/08/23 Dr Kathlynn note  Assessment & Plan Trigger finger  Bilateral trigger finger affects both hands, causing locking  of the middle fingers daily, especially in the morning, for over five months. She cannot tolerate steroid injections due to adverse reactions. Hand therapy and paraffin baths are considered as alternative treatments. Surgery remains an option if conservative measures fail, with minimal risk for complications. She will be referred to hand therapy at [facility] in [place] with therapist Deland. Paraffin bath therapy is considered to increase blood flow and reduce pain. Reassessment is planned in one month to evaluate the effectiveness of hand therapy. If symptoms persist, surgical intervention to release the A1 pulley will be discussed.  Dupuytren's contracture  A fibrous band under the skin suggests Dupuytren's contracture, in addition to trigger finger.  Diabetes mellitus without complications  Diabetes is well-controlled with an A1c of 5.5. Blood sugar levels are managed through diet without medication. Diagnoses and all orders for this visit:  Trigger middle finger of left hand - Ambulatory Referral to Occupational Therapy  Trigger middle finger of right hand - Ambulatory Referral to Occupational Therapy  Controlled type 2 diabetes mellitus with stage 3 chronic kidney disease, with long-term current use of insulin (CMS/HHS-HCC)   PRECAUTIONS: None    WEIGHT BEARING RESTRICTIONS: No  PAIN:  Are you having pain?  5/10 tenderness over right third A1 pulley, left 2/10 tenderness over the third A1 pulley  FALLS: Has patient fallen in last 6 months? No  LIVING ENVIRONMENT: Lives with: lives alone   PLOF: Prior to December no triggering.  Stiffness in the fingers.  Patient independent doing housework and cooking and laundry.  Takes care of her 50-year-old great grand daughter in the afternoons 1-5.  Drive  PATIENT GOALS: I want to get the trigger fingers better and the locking and the pain so I do not need surgery.  NEXT MD VISIT: 4 to 6 weeks  OBJECTIVE:  Note:  Objective measures were completed at Evaluation unless otherwise noted.  HAND DOMINANCE: Right  ADLs: Patient limited with increased triggering as well as pain in gripping activities, turning a doorknob holding objects pulling pushing.  FUNCTIONAL OUTCOME MEASURES: Neck session  UPPER EXTREMITY ROM:     Active ROM Right eval Left eval  Shoulder flexion    Shoulder abduction    Shoulder adduction    Shoulder extension    Shoulder internal rotation    Shoulder external rotation    Elbow flexion    Elbow extension    Wrist flexion 62 72  Wrist extension    Wrist ulnar deviation    Wrist radial deviation    Wrist pronation    Wrist supination    (Blank rows = not tested)  Active ROM Right eval Left eval R/L 09/19/23 PROM R/L 11/04/23 AROM  R/L 11/21/23 R 11/28/23  Thumb MCP (0-60)        Thumb IP (0-80)        Thumb Radial abd/add (0-55)         Thumb Palmar abd/add (0-45)         Thumb Opposition to Small Finger         Index MCP (0-90) 85 80   80/70 70/70 75  Index PIP (0-100) 90  95  90/90 90/90   Index DIP (0-70)          Long MCP (0-90) 75  85  90/90 60/80 60/80  75  Long PIP (0-100) 90  ext -15  95 et -10  -10 ext coming in 95/L100 80/95 80/90   Long DIP (0-70)     60/70     Ring MCP (0-90)  85  90  60/80 75/85 75  Ring PIP (0-100)  90  95  75/100 90/100   Ring DIP (0-70)          Little MCP (0-90)  90 90   80/90 80/90 85  Little PIP (0-100)  85  95  70/90 90/90   Little DIP (0-70)          (Blank rows = not tested)   HAND FUNCTION: Grip strength: Right: 30 lbs; Left: 30 lbs, Lateral pinch: Right: 10 lbs, Left: 13 lbs, and 3 point pinch: Right: 9 lbs, Left: 6 lbs 09/19/23 Grip strength: Right: 30 lbs; Left: 38 lbs, Lateral pinch: Right: 11 lbs, Left: 13 lbs, and 3 point pinch: Right: 11 lbs, Left: 8 lbs 09/25/23 Grip strength: Right: 34 lbs; Left: 42 lbs, Lateral pinch: Right: 11 lbs, Left: 13 lbs, and 3 point pinch: Right: 11 lbs, Left: 9 lbs 8/4//25 Grip  strength: Right: 18 lbs; Left: 32 lbs, Lateral pinch: Right: 9 lbs, Left: 11 lbs, and 3 point pinch: Right: 9 lbs, Left: 8 lbs 11/04/23 Grip strength: Right: 12 lbs; Left: 25 lbs, Lateral pinch: Right: 8 lbs, Left: 10 lbs,  and 3 point pinch: Right: 7 lbs, Left: 9 lbs 11/07/23 Grip strength: Right: 15 lbs; Left: 30 lbs, Lateral pinch: Right: 8 lbs, Left: 10 lbs, and 3 point pinch: Right: 7 lbs, Left: 9 lbs COORDINATION: Limited because of triggering when attempting to bend or flex third digit SENSATION: Denies any sensory issues  EDEMA: A1 pulleys tender in the large  COGNITION: Overall cognitive status: Within functional limits for tasks assessed     TREATMENT DATE: 11/27/23        Patient arrived seen hand surgeon yesterday.  To continue 4 weeks of therapy until follow-up.  On the 12/29/2023 Patient wears wrist prefab splints for carpal tunnel symptoms at nighttime as well as during the day some Patient this date no tenderness or positive Tinel over carpal tunnel. Patient do have increased tenderness over the right third A1 pulley but show increase MCP flexion with composite flexion.  Compared to prior. Continues to have stiffness in the left third digit with a 2/10 tenderness over the A1 pulley  Patient reports she done contrast this morning on the left. Done contrast 8 minutes on the right alternating heat and cold to decrease edema and stiffness and pain and increased motion prior to soft tissue mobilization  Manual Therapy: Following modalities, pt was seen for manual therapy techniques performed by therapist for soft tissue mobilization and massage use of foam roller over the volar surface of the hands and digits in addition to forearm at home Use of Graston tool #6 performed over palm and for brushing and sweeping over the volar digits to decrease pain, stiffness and promote increased ROM-focusing mostly on third digits  Patient with full PIP extension on bilateral hands on third  digits after soft tissue. Patient to focus at home same. Done composite forearm flexor stretch as well as mobilization and gentle traction to wrist with wrist flexion extension and radial ulnar deviation facilitation  Therapeutic Exercises:  Patient to focus on left hand performing MCP flexion to 90 followed by DIP/PIP flexion stretch and passive range of motion composite flexion 10 reps Followed by placing hold for composite flexion 5 reps Right patient to focus on composite flexion 10 reps passive range of motion 5 times a day 10 clinic Followed by placing hold composite flexion 5 reps hold 3 seconds      PATIENT EDUCATION: Education details: findings of eval and HEP  Person educated: Patient Education method: Explanation, Demonstration, Tactile cues, Verbal cues, and Handouts Education comprehension: verbalized understanding, returned demonstration, verbal cues required, and needs further education    GOALS: Goals reviewed with patient? Yes  LONG TERM GOALS: Target date: 6 wks  Patient be independent home program to increase motion and decrease pain to less than 3/10. Baseline: Patient no knowledge of home program.  Pain and tenderness 8-9/10.  Triggering several times in the clinic and reported during the day. Goal status: Met  2.  Patient's passive range of motion to bilateral third digits improved to touching palm with no increased pain or triggering. Baseline: Third digit right hand MC 75 and PIP 90.  Left third MCP 85 and PIP 95.  But triggered sensation.  Tenderness and pain 8-9/10.  Patient also showing decreased PIP extension in bilateral thirds -10 to -15 degrees NOW patient pain decreased to 0-1/10.  Was able to touch palm with passive range of motion the last week or 2.  But  increase inflammation in her body and pain with movement in multiple joints-digits your PCP and done  blood work await results Goal status: Progressing  3.  Patient verbalize 2-3 modifications and  adaptations to decrease triggering as well as pain in bilateral hands. Baseline: Patient no knowledge.  Pain is 8-9/10 over bilateral third digits as well as A1 pulley.  Triggering several times during the day as well as in session.   Goal status: Met  4.  Patient's show composite flexion touching palm to use in ADLs and IADLs with less than 2 episodes of triggering a day Baseline: Patient with several times during the day or every time attempting to do composite flexion triggering at bilateral third digits.  Pain 8-9/10 pain.  Decreased range of motion in bilateral digits Goal status: Progressing  5.  Grip and prevention strength improved to within normal limits for her age with no increased pain and symptoms to return to prior level of function Baseline: Grip 30 pounds left and right hand lateral pinch on the right 10 pounds left 13 pounds and 3 point pinch 9 and left 6 pounds Goal status: progressing    ASSESSMENT:  CLINICAL IMPRESSION: Patient was referred to occupational therapy for bilateral third digit trigger fingers.  Patient reports started in December 24.  Patient not able to get any shots because of allergic reactions and diabetic.  Patient pain at eval  8-9/10 over bilateral third digits as well as A1 pulleys.  Patient showing in session several episodes of triggering.  Also report every attempt of composite fist third digits triggering or locking.  With worse in the morning.  Patient with decreased flexion in all digits as well as decreased PIP extension in bilateral third digits with -10 to -15 degrees.  Grip and prehension strength decreased in bilateral hands NOW .  Patient is to see hand surgeon.  Continue 4 more weeks of therapy to try and avoid surgery.  Will follow-up in October with surgeon.  Patient to continue with the bilateral wrist splints for carpal tunnel symptoms.  Did review with patient modifications using her cell phone and driving and over gripping.  Patient showed  increased MCP flexion with composite flexion on right hand this date compared to the last 2 sessions.  Patient continues to be tender over the left third A1 pulley 2/10 in the right 6/10.  Patient to continue with contrast as well as composite flexion on the right with followed by placing hold on the left MC flexion, intrinsic exercise with a passive range of motion composite.  Patient can benefit from skilled OT services to decrease pain and inflammation and increase motion and strength as well as joint protection and modification education, splinting and modalities to return to prior level of function.  PERFORMANCE DEFICITS: in functional skills including ADLs, IADLs, ROM, strength, pain, flexibility, decreased knowledge of use of DME, and UE functional use,   and psychosocial skills including environmental adaptation and routines and behaviors.   IMPAIRMENTS: are limiting patient from ADLs, IADLs, rest and sleep, play, leisure, and social participation.   COMORBIDITIES: has no other co-morbidities that affects occupational performance. Patient will benefit from skilled OT to address above impairments and improve overall function.  MODIFICATION OR ASSISTANCE TO COMPLETE EVALUATION: No modification of tasks or assist necessary to complete an evaluation.  OT OCCUPATIONAL PROFILE AND HISTORY: Problem focused assessment: Including review of records relating to presenting problem.  CLINICAL DECISION MAKING: LOW - limited treatment options, no task modification necessary  REHAB POTENTIAL: Good for goals  EVALUATION COMPLEXITY: Low   PLAN:  OT FREQUENCY: 1  time a week  OT DURATION: 6 weeks  PLANNED INTERVENTIONS: 97168 OT Re-evaluation, 97535 self care/ADL training, 02889 therapeutic exercise, 97530 therapeutic activity, 97140 manual therapy, 97035 ultrasound, 97018 paraffin, 02960 fluidotherapy, 97034 contrast bath, 97033 iontophoresis, orthotics, joint protection and modifications   CONSULTED  AND AGREED WITH PLAN OF CARE: Patient  Ancel Peters, OTR/L,CLT 11/28/2023, 11:05 AM

## 2023-12-05 ENCOUNTER — Ambulatory Visit: Attending: Orthopedic Surgery | Admitting: Occupational Therapy

## 2023-12-05 DIAGNOSIS — M65332 Trigger finger, left middle finger: Secondary | ICD-10-CM | POA: Insufficient documentation

## 2023-12-05 DIAGNOSIS — M79642 Pain in left hand: Secondary | ICD-10-CM | POA: Insufficient documentation

## 2023-12-05 DIAGNOSIS — M25642 Stiffness of left hand, not elsewhere classified: Secondary | ICD-10-CM | POA: Diagnosis present

## 2023-12-05 DIAGNOSIS — M25641 Stiffness of right hand, not elsewhere classified: Secondary | ICD-10-CM | POA: Insufficient documentation

## 2023-12-05 DIAGNOSIS — M79641 Pain in right hand: Secondary | ICD-10-CM | POA: Diagnosis present

## 2023-12-05 DIAGNOSIS — M65331 Trigger finger, right middle finger: Secondary | ICD-10-CM | POA: Diagnosis present

## 2023-12-05 NOTE — Therapy (Signed)
 OUTPATIENT OCCUPATIONAL THERAPY ORTHO TREATMENT  Patient Name: Kathryn Owen MRN: [id number] DOB:10-10-1945, 78 y.o., female Today's Date: 12/05/2023  PCP: Dr Ross MART PROVIDER: Dr Kathlynn  END OF SESSION:  OT End of Session - 12/05/23 0957     Visit Number 18    Number of Visits 21    Date for Recertification  12/19/23    OT Start Time 0957    OT Stop Time 1035    OT Time Calculation (min) 38 min    Activity Tolerance Patient tolerated treatment well    Behavior During Therapy WFL for tasks assessed/performed            Past Medical History:  Diagnosis Date   Allergy history unknown    Arthritis    Asthma    Carpal tunnel syndrome    Fatty liver    GERD (gastroesophageal reflux disease)    Herpes zoster    HLD (hyperlipidemia)    mixed   HTN (hypertension)    Hypercalcemia    Hyperparathyroidism    secondary   Insomnia    Osteopenia    Rosacea    Vitamin B deficiency    Vitamin D deficiency    Past Surgical History:  Procedure Laterality Date   CARDIOVASCULAR STRESS TEST     nml    CESAREAN SECTION     x3   Patient Active Problem List   Diagnosis Date Noted   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  The left middle finger still wants to trigger here and there.  I think the numbness is better.  I do like the brace she gave me in the right hand but the left hand brace that I got from orthopedics is hurting my hand.  I did stop this week wearing the little finger splints  Pt accompanied by: self  PERTINENT HISTORY:  Assessment/Plan: 07/08/23 Dr Kathlynn note  Assessment & Plan Trigger finger  Bilateral trigger finger affects both hands, causing locking of the middle fingers daily, especially in the morning, for over five months. She cannot tolerate steroid injections due to adverse reactions. Hand therapy and paraffin baths are considered as alternative treatments. Surgery remains an option if conservative measures fail, with minimal risk for complications. She will be referred to hand therapy at [facility] in [place] with therapist Deland. Paraffin bath therapy is considered to increase blood flow and reduce pain. Reassessment is planned in one month to evaluate the effectiveness of hand therapy. If symptoms persist, surgical intervention to release the A1 pulley will be discussed.  Dupuytren's contracture  A fibrous band under the skin suggests Dupuytren's contracture, in addition to trigger finger.  Diabetes mellitus without complications  Diabetes is well-controlled with an A1c of 5.5. Blood sugar levels are managed through diet without medication. Diagnoses and all orders for this visit:  Trigger middle finger of left hand - Ambulatory Referral to Occupational Therapy  Trigger middle finger of right hand - Ambulatory Referral to Occupational Therapy  Controlled type 2 diabetes mellitus with stage 3 chronic kidney disease,  with long-term current use of insulin (CMS/HHS-HCC)   PRECAUTIONS: None    WEIGHT BEARING RESTRICTIONS: No  PAIN:  Are you having pain?  Tenderness over bilateral third digits A1 pulleys 1-2/10  FALLS: Has patient fallen in last 6 months? No  LIVING ENVIRONMENT: Lives with: lives alone   PLOF: Prior to December no triggering.  Stiffness in the fingers.  Patient independent doing housework and cooking and laundry.  Takes care of her 55-year-old great grand daughter in the afternoons 1-5.  Drive  PATIENT GOALS: I want to get the trigger fingers better and  the locking and the pain so I do not need surgery.  NEXT MD VISIT: 4 to 6 weeks  OBJECTIVE:  Note: Objective measures were completed at Evaluation unless otherwise noted.  HAND DOMINANCE: Right  ADLs: Patient limited with increased triggering as well as pain in gripping activities, turning a doorknob holding objects pulling pushing.  FUNCTIONAL OUTCOME MEASURES: Neck session  UPPER EXTREMITY ROM:     Active ROM Right eval Left eval  Shoulder flexion    Shoulder abduction    Shoulder adduction    Shoulder extension    Shoulder internal rotation    Shoulder external rotation    Elbow flexion    Elbow extension    Wrist flexion 62 72  Wrist extension    Wrist ulnar deviation    Wrist radial deviation    Wrist pronation    Wrist supination    (Blank rows = not tested)  Active ROM Right eval Left eval R/L 09/19/23 PROM R/L 11/04/23 AROM  R/L 11/21/23 R 11/28/23 R 12/05/23  Thumb MCP (0-60)         Thumb IP (0-80)         Thumb Radial abd/add (0-55)          Thumb Palmar abd/add (0-45)          Thumb Opposition to Small Finger          Index MCP (0-90) 85 80   80/70 70/70 75 75  Index PIP (0-100) 90  95  90/90 90/90  90  Index DIP (0-70)           Long MCP (0-90) 75  85  90/90 60/80 60/80  75 75  Long PIP (0-100) 90  ext -15  95 et -10  -10 ext coming in 95/L100 80/95 80/90  90  Long DIP (0-70)     60/70      Ring MCP (0-90)  85  90  60/80 75/85 75 80  Ring PIP (0-100)  90  95  75/100 90/100  90  Ring DIP (0-70)           Little MCP (0-90)  90 90   80/90 80/90 85 90  Little PIP (0-100)  85  95  70/90 90/90  90  Little DIP (0-70)           (Blank rows = not tested)   HAND FUNCTION: Grip strength: Right: 30 lbs; Left: 30 lbs, Lateral pinch: Right: 10 lbs, Left: 13 lbs, and 3 point pinch: Right: 9 lbs, Left: 6 lbs 09/19/23 Grip strength: Right: 30 lbs; Left: 38 lbs, Lateral pinch: Right: 11 lbs, Left: 13 lbs, and 3 point pinch: Right: 11 lbs, Left: 8 lbs 09/25/23  Grip strength: Right: 34 lbs; Left: 42 lbs, Lateral pinch: Right: 11 lbs, Left: 13 lbs, and 3 point pinch: Right: 11 lbs, Left: 9 lbs 8/4//25 Grip strength: Right: 18 lbs;  Left: 32 lbs, Lateral pinch: Right: 9 lbs, Left: 11 lbs, and 3 point pinch: Right: 9 lbs, Left: 8 lbs 11/04/23 Grip strength: Right: 12 lbs; Left: 25 lbs, Lateral pinch: Right: 8 lbs, Left: 10 lbs, and 3 point pinch: Right: 7 lbs, Left: 9 lbs 11/07/23 Grip strength: Right: 15 lbs; Left: 30 lbs, Lateral pinch: Right: 8 lbs, Left: 10 lbs, and 3 point pinch: Right: 7 lbs, Left: 9 lbs 12/05/23 Grip strength: Right: 19 lbs; Left: 30 lbs, Lateral pinch: Right: 10 lbs, Left: 12 lbs, and 3 point pinch: Right: 7 lbs, Left: 9 lbs COORDINATION: Limited because of triggering when attempting to bend or flex third digit SENSATION: Denies any sensory issues  EDEMA: A1 pulleys tender in the large  COGNITION: Overall cognitive status: Within functional limits for tasks assessed     TREATMENT DATE: 12/05/23        Patient seen last week hand surgeon- to continue 4 weeks of therapy until follow-up.  On the 12/29/2023 Patient arrived this date with increased AROM as well as inflammation in the right hand. She progress in grip and lateral pinch. Most stiffness in third digits on bilateral hands Patient stopped wearing her MC block splints on bilateral third digits this past week Patient wears wrist prefab splints for carpal tunnel symptoms at nighttime as well as during the day some Patient this date no tenderness or positive Tinel over carpal tunnel. Tenderness over bilateral A1 pulleys of third digits improved 1-2/10   Done contrast 8 minutes on right hand and paraffin on left to decrease stiffness increase motion on the left and decrease edema and decrease stiffness increase motion on the right prior to manual therapy  Manual Therapy: Following modalities, pt was seen for manual therapy techniques performed by therapist for soft tissue  mobilization and massage use of foam roller over the volar surface of the hands and digits in addition to forearm at home Use of Graston tool #6 performed over palm and for brushing and sweeping over the volar digits to decrease pain, stiffness and promote increased ROM-focusing mostly on third digits  Patient with full PIP extension on bilateral hands on third digits after soft tissue. Patient to focus at home same. Done composite forearm flexor stretch as well as mobilization and gentle traction to wrist with wrist flexion extension and radial ulnar deviation facilitation Patient to continue with prayer stretch for composite wrist extension at home  Therapeutic Exercises:  Patient to focus on left hand performing MCP flexion to 90 followed by DIP/PIP flexion stretch and passive range of motion composite flexion 10 reps Followed by active range of motion composite light flexion 10 reps Last 5 degrees 10 degrees of composite flexion does have a or trigger and third digit But not consistent  Right patient to focus on passive range of motion intrinsic fist , followed by composite flexion 10 reps passive range of motion 5 times a day 10  Followed by placing hold composite flexion 5 reps hold 3 seconds  No triggering     PATIENT EDUCATION: Education details: findings of eval and HEP  Person educated: Patient Education method: Explanation, Demonstration, Tactile cues, Verbal cues, and Handouts Education comprehension: verbalized understanding, returned demonstration, verbal cues required, and needs further education    GOALS: Goals reviewed with patient? Yes  LONG TERM GOALS: Target date: 6 wks  Patient be independent home program to increase motion and decrease pain to less than 3/10. Baseline: Patient no knowledge of home program.  Pain  and tenderness 8-9/10.  Triggering several times in the clinic and reported during the day. Goal status: Met  2.  Patient's passive range of motion  to bilateral third digits improved to touching palm with no increased pain or triggering. Baseline: Third digit right hand MC 75 and PIP 90.  Left third MCP 85 and PIP 95.  But triggered sensation.  Tenderness and pain 8-9/10.  Patient also showing decreased PIP extension in bilateral thirds -10 to -15 degrees NOW patient pain decreased to 0-1/10.  Was able to touch palm with passive range of motion the last week or 2.  But  increase inflammation in her body and pain with movement in multiple joints-digits your PCP and done blood work await results Goal status: Progressing  3.  Patient verbalize 2-3 modifications and adaptations to decrease triggering as well as pain in bilateral hands. Baseline: Patient no knowledge.  Pain is 8-9/10 over bilateral third digits as well as A1 pulley.  Triggering several times during the day as well as in session.   Goal status: Met  4.  Patient's show composite flexion touching palm to use in ADLs and IADLs with less than 2 episodes of triggering a day Baseline: Patient with several times during the day or every time attempting to do composite flexion triggering at bilateral third digits.  Pain 8-9/10 pain.  Decreased range of motion in bilateral digits Goal status: Progressing  5.  Grip and prevention strength improved to within normal limits for her age with no increased pain and symptoms to return to prior level of function Baseline: Grip 30 pounds left and right hand lateral pinch on the right 10 pounds left 13 pounds and 3 point pinch 9 and left 6 pounds Goal status: progressing    ASSESSMENT:  CLINICAL IMPRESSION: Patient was referred to occupational therapy for bilateral third digit trigger fingers.  Patient reports started in December 24.  Patient not able to get any shots because of allergic reactions and diabetic.  Patient pain at eval  8-9/10 over bilateral third digits as well as A1 pulleys.  Patient showing in session several episodes of triggering.   Also report every attempt of composite fist third digits triggering or locking.  With worse in the morning.  Patient with decreased flexion in all digits as well as decreased PIP extension in bilateral third digits with -10 to -15 degrees.  Grip and prehension strength decreased in bilateral hands NOW .  Patient did see his hand surgeon last week continue 4 more weeks of therapy to try and avoid surgery.  Will follow-up end October with surgeon.  Patient to continue with the bilateral wrist splints for carpal tunnel symptoms.  Did review with patient modifications using her cell phone and driving and over gripping.  Patient showed increased MCP flexion with composite flexion on right hand this date again.  Patient continues to show progress and tenderness over bilateral third digit A1 pulleys.  This date patient also show increased grip and lateral pinch strength-patient report carpal tunnel symptoms improving.  Patient to continue with contrast as well as composite flexion on the right with followed by placing hold on the left MC flexion, intrinsic exercise with a passive range of motion composite.  Patient can benefit from skilled OT services to decrease pain and inflammation and increase motion and strength as well as joint protection and modification education, splinting and modalities to return to prior level of function.  PERFORMANCE DEFICITS: in functional skills including ADLs, IADLs, ROM,  strength, pain, flexibility, decreased knowledge of use of DME, and UE functional use,   and psychosocial skills including environmental adaptation and routines and behaviors.   IMPAIRMENTS: are limiting patient from ADLs, IADLs, rest and sleep, play, leisure, and social participation.   COMORBIDITIES: has no other co-morbidities that affects occupational performance. Patient will benefit from skilled OT to address above impairments and improve overall function.  MODIFICATION OR ASSISTANCE TO COMPLETE EVALUATION:  No modification of tasks or assist necessary to complete an evaluation.  OT OCCUPATIONAL PROFILE AND HISTORY: Problem focused assessment: Including review of records relating to presenting problem.  CLINICAL DECISION MAKING: LOW - limited treatment options, no task modification necessary  REHAB POTENTIAL: Good for goals  EVALUATION COMPLEXITY: Low   PLAN:  OT FREQUENCY: 1 time a week  OT DURATION: 6 weeks  PLANNED INTERVENTIONS: 97168 OT Re-evaluation, 97535 self care/ADL training, 02889 therapeutic exercise, 97530 therapeutic activity, 97140 manual therapy, 97035 ultrasound, 97018 paraffin, 02960 fluidotherapy, 97034 contrast bath, 97033 iontophoresis, orthotics, joint protection and modifications   CONSULTED AND AGREED WITH PLAN OF CARE: Patient  Ancel Peters, OTR/L,CLT 12/05/2023, 1:13 PM

## 2023-12-12 ENCOUNTER — Ambulatory Visit: Admitting: Occupational Therapy

## 2023-12-12 ENCOUNTER — Encounter: Payer: Self-pay | Admitting: Occupational Therapy

## 2023-12-12 DIAGNOSIS — M65331 Trigger finger, right middle finger: Secondary | ICD-10-CM | POA: Diagnosis not present

## 2023-12-12 DIAGNOSIS — M79642 Pain in left hand: Secondary | ICD-10-CM

## 2023-12-12 DIAGNOSIS — M79641 Pain in right hand: Secondary | ICD-10-CM

## 2023-12-12 DIAGNOSIS — M25641 Stiffness of right hand, not elsewhere classified: Secondary | ICD-10-CM

## 2023-12-12 NOTE — Therapy (Signed)
 OUTPATIENT OCCUPATIONAL THERAPY ORTHO TREATMENT  Patient Name: Kathryn Owen MRN: [id number] DOB:1946-02-02, 78 y.o., female Today's Date: 12/12/2023  PCP: Dr Ross MART PROVIDER: Dr Kathlynn  END OF SESSION:  OT End of Session - 12/12/23 1046     Visit Number 19    Number of Visits 21    Date for Recertification  12/19/23    OT Start Time 1040    OT Stop Time 1120    OT Time Calculation (min) 40 min    Activity Tolerance Patient tolerated treatment well    Behavior During Therapy WFL for tasks assessed/performed             Past Medical History:  Diagnosis Date   Allergy history unknown    Arthritis    Asthma    Carpal tunnel syndrome    Fatty liver    GERD (gastroesophageal reflux disease)    Herpes zoster    HLD (hyperlipidemia)    mixed   HTN (hypertension)    Hypercalcemia    Hyperparathyroidism    secondary   Insomnia    Osteopenia    Rosacea    Vitamin B deficiency    Vitamin D deficiency    Past Surgical History:  Procedure Laterality Date   CARDIOVASCULAR STRESS TEST     nml    CESAREAN SECTION     x3   Patient Active Problem List   Diagnosis Date Noted   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 Pt was late to session but did successfully drive herself here.   Pt accompanied by: self  PERTINENT HISTORY: Assessment/Plan: 07/08/23 Dr Kathlynn note  Assessment & Plan Trigger finger  Bilateral trigger finger affects both hands, causing locking of the middle fingers daily, especially in the morning, for over  five months. She cannot tolerate steroid injections due to adverse reactions. Hand therapy and paraffin baths are considered as alternative treatments. Surgery remains an option if conservative measures fail, with minimal risk for complications. She will be referred to hand therapy at [facility] in [place] with therapist Deland. Paraffin bath therapy is considered to increase blood flow and reduce pain. Reassessment is planned in one month to evaluate the effectiveness of hand therapy. If symptoms persist, surgical intervention to release the A1 pulley will be discussed.  Dupuytren's contracture  A fibrous band under the skin suggests Dupuytren's contracture, in addition to trigger finger.  Diabetes mellitus without complications  Diabetes is well-controlled with an A1c of 5.5. Blood sugar levels are managed through diet without medication. Diagnoses and all orders for this visit:  Trigger middle finger of left hand - Ambulatory Referral to Occupational Therapy  Trigger middle finger of right hand - Ambulatory Referral to Occupational Therapy  Controlled type 2 diabetes mellitus with stage 3 chronic kidney disease, with long-term current use of insulin (CMS/HHS-HCC)   PRECAUTIONS: None    WEIGHT BEARING RESTRICTIONS: No  PAIN:  Are you having pain?  Tenderness over bilateral third digits A1 pulleys 1-2/10  FALLS: Has patient fallen in last 6 months?  No  LIVING ENVIRONMENT: Lives with: lives alone   PLOF: Prior to December no triggering.  Stiffness in the fingers.  Patient independent doing housework and cooking and laundry.  Takes care of her 44-year-old great grand daughter in the afternoons 1-5.  Drive  PATIENT GOALS: I want to get the trigger fingers better and the locking and the pain so I do not need surgery.  NEXT MD VISIT: 4 to 6 weeks  OBJECTIVE:  Note: Objective measures were completed at Evaluation unless otherwise noted.  HAND DOMINANCE:  Right  ADLs: Patient limited with increased triggering as well as pain in gripping activities, turning a doorknob holding objects pulling pushing.  FUNCTIONAL OUTCOME MEASURES: Neck session  UPPER EXTREMITY ROM:     Active ROM Right eval Left eval  Shoulder flexion    Shoulder abduction    Shoulder adduction    Shoulder extension    Shoulder internal rotation    Shoulder external rotation    Elbow flexion    Elbow extension    Wrist flexion 62 72  Wrist extension    Wrist ulnar deviation    Wrist radial deviation    Wrist pronation    Wrist supination    (Blank rows = not tested)  Active ROM Right eval Left eval R/L 09/19/23 PROM R/L 11/04/23 AROM  R/L 11/21/23 R 11/28/23 R 12/05/23  Thumb MCP (0-60)         Thumb IP (0-80)         Thumb Radial abd/add (0-55)          Thumb Palmar abd/add (0-45)          Thumb Opposition to Small Finger          Index MCP (0-90) 85 80   80/70 70/70 75 75  Index PIP (0-100) 90  95  90/90 90/90  90  Index DIP (0-70)           Long MCP (0-90) 75  85  90/90 60/80 60/80  75 75  Long PIP (0-100) 90  ext -15  95 et -10  -10 ext coming in 95/L100 80/95 80/90  90  Long DIP (0-70)     60/70      Ring MCP (0-90)  85  90  60/80 75/85 75 80  Ring PIP (0-100)  90  95  75/100 90/100  90  Ring DIP (0-70)           Little MCP (0-90)  90 90   80/90 80/90 85 90  Little PIP (0-100)  85  95  70/90 90/90  90  Little DIP (0-70)           (Blank rows = not tested)   HAND FUNCTION: Grip strength: Right: 30 lbs; Left: 30 lbs, Lateral pinch: Right: 10 lbs, Left: 13 lbs, and 3 point pinch: Right: 9 lbs, Left: 6 lbs 09/19/23 Grip strength: Right: 30 lbs; Left: 38 lbs, Lateral pinch: Right: 11 lbs, Left: 13 lbs, and 3 point pinch: Right: 11 lbs, Left: 8 lbs 09/25/23 Grip strength: Right: 34 lbs; Left: 42 lbs, Lateral pinch: Right: 11 lbs, Left: 13 lbs, and 3 point pinch: Right: 11 lbs, Left: 9 lbs 8/4//25 Grip strength: Right: 18 lbs; Left: 32 lbs, Lateral  pinch: Right: 9 lbs, Left: 11 lbs, and 3 point pinch: Right: 9 lbs, Left: 8 lbs 11/04/23 Grip strength: Right: 12 lbs; Left: 25 lbs, Lateral pinch: Right: 8 lbs, Left: 10 lbs, and 3 point pinch: Right:  7 lbs, Left: 9 lbs 11/07/23 Grip strength: Right: 15 lbs; Left: 30 lbs, Lateral pinch: Right: 8 lbs, Left: 10 lbs, and 3 point pinch: Right: 7 lbs, Left: 9 lbs 12/05/23 Grip strength: Right: 19 lbs; Left: 30 lbs, Lateral pinch: Right: 10 lbs, Left: 12 lbs, and 3 point pinch: Right: 7 lbs, Left: 9 lbs COORDINATION: Limited because of triggering when attempting to bend or flex third digit SENSATION: Denies any sensory issues  EDEMA: A1 pulleys tender in the large  COGNITION: Overall cognitive status: Within functional limits for tasks assessed     TREATMENT DATE: 12/12/23      Patient stopped wearing her MC block splints on bilateral third digits this past week, wears at night but reports removing due to discomfort in the middle of the night.  Patient wears wrist prefab splints for carpal tunnel symptoms at nighttime as well as during the day with activity including driving today. Reports L brace rubbing at thumb, issued M size with pt reporting better fit.  Patient this date no tenderness or positive Tinel over carpal tunnel. Tenderness over bilateral A1 pulleys of third digits improved 1/10  Done contrast 8 minutes on right hand and paraffin on left to decrease stiffness increase motion on the left and decrease edema and decrease stiffness increase motion on the right prior to manual therapy  Manual Therapy: Following modalities, pt was seen for manual therapy techniques performed by therapist for soft tissue mobilization and massage Use of Graston tool #6 performed over palm and for brushing and sweeping over the volar digits to decrease pain, stiffness and promote increased ROM-focusing mostly on third digits  Patient with full PIP extension on bilateral hands on third digits after soft  tissue. Patient to continue with prayer stretch for composite wrist extension at home, cues to hold ~3 sec in wrist extension as tolerable   Therapeutic Exercises:  Patient to focus on left hand performing MCP flexion to 90 followed by DIP/PIP flexion stretch and passive range of motion composite flexion 10 reps Followed by active range of motion composite light flexion 10 reps Last 5 degrees of composite flexion does have a or trigger at third digit inconsistently on L hand Right hand patient to focus on passive range of motion intrinsic fist , followed by composite flexion 10 reps passive range of motion 5 times a day 10  Followed by placing hold composite flexion 5 reps hold 3 seconds  A1 pulley stretch 10 reps, plan to add to HEP   PATIENT EDUCATION: Education details: findings of eval and HEP  Person educated: Patient Education method: Explanation, Demonstration, Tactile cues, Verbal cues, and Handouts Education comprehension: verbalized understanding, returned demonstration, verbal cues required, and needs further education    GOALS: Goals reviewed with patient? Yes  LONG TERM GOALS: Target date: 6 wks  Patient be independent home program to increase motion and decrease pain to less than 3/10. Baseline: Patient no knowledge of home program.  Pain and tenderness 8-9/10.  Triggering several times in the clinic and reported during the day. Goal status: Met  2.  Patient's passive range of motion to bilateral third digits improved to touching palm with no increased pain or triggering. Baseline: Third digit right hand MC 75 and PIP 90.  Left third MCP 85 and PIP 95.  But triggered sensation.  Tenderness and pain 8-9/10.  Patient also showing decreased PIP extension in bilateral thirds -10 to -15 degrees NOW patient pain decreased to 0-1/10.  Was able to  touch palm with passive range of motion the last week or 2.  But  increase inflammation in her body and pain with movement in multiple  joints-digits your PCP and done blood work await results Goal status: Progressing  3.  Patient verbalize 2-3 modifications and adaptations to decrease triggering as well as pain in bilateral hands. Baseline: Patient no knowledge.  Pain is 8-9/10 over bilateral third digits as well as A1 pulley.  Triggering several times during the day as well as in session.   Goal status: Met  4.  Patient's show composite flexion touching palm to use in ADLs and IADLs with less than 2 episodes of triggering a day Baseline: Patient with several times during the day or every time attempting to do composite flexion triggering at bilateral third digits.  Pain 8-9/10 pain.  Decreased range of motion in bilateral digits Goal status: Progressing  5.  Grip and prevention strength improved to within normal limits for her age with no increased pain and symptoms to return to prior level of function Baseline: Grip 30 pounds left and right hand lateral pinch on the right 10 pounds left 13 pounds and 3 point pinch 9 and left 6 pounds Goal status: progressing    ASSESSMENT:  CLINICAL IMPRESSION: Patient was referred to occupational therapy for bilateral third digit trigger fingers.  Patient reports started in December 24.  Patient not able to get any shots because of allergic reactions and diabetic.  Patient pain at eval  8-9/10 over bilateral third digits as well as A1 pulleys.  Patient showing in session several episodes of triggering.  Also report every attempt of composite fist third digits triggering or locking.  With worse in the morning.  Patient with decreased flexion in all digits as well as decreased PIP extension in bilateral third digits with -10 to -15 degrees.  Grip and prehension strength decreased in bilateral hands NOW . Patient to continue with the bilateral wrist splints for carpal tunnel symptoms, issued Med size for L hand due to rubbing along thumb.  Patient continues to show progress and tenderness over  bilateral third digit A1 pulleys. Added A1 pulley stretch to HEP, plan to review next session. Patient to continue with contrast as well as composite flexion on the right with followed by placing hold on the left MC flexion, intrinsic exercise with a passive range of motion composite.  Patient can benefit from skilled OT services to decrease pain and inflammation and increase motion and strength as well as joint protection and modification education, splinting and modalities to return to prior level of function.  PERFORMANCE DEFICITS: in functional skills including ADLs, IADLs, ROM, strength, pain, flexibility, decreased knowledge of use of DME, and UE functional use,   and psychosocial skills including environmental adaptation and routines and behaviors.   IMPAIRMENTS: are limiting patient from ADLs, IADLs, rest and sleep, play, leisure, and social participation.   COMORBIDITIES: has no other co-morbidities that affects occupational performance. Patient will benefit from skilled OT to address above impairments and improve overall function.  MODIFICATION OR ASSISTANCE TO COMPLETE EVALUATION: No modification of tasks or assist necessary to complete an evaluation.  OT OCCUPATIONAL PROFILE AND HISTORY: Problem focused assessment: Including review of records relating to presenting problem.  CLINICAL DECISION MAKING: LOW - limited treatment options, no task modification necessary  REHAB POTENTIAL: Good for goals  EVALUATION COMPLEXITY: Low   PLAN:  OT FREQUENCY: 1 time a week  OT DURATION: 6 weeks  PLANNED INTERVENTIONS: 02831 OT  Re-evaluation, 97535 self care/ADL training, 02889 therapeutic exercise, 97530 therapeutic activity, 97140 manual therapy, 97035 ultrasound, 97018 paraffin, 02960 fluidotherapy, 97034 contrast bath, 97033 iontophoresis, orthotics, joint protection and modifications   CONSULTED AND AGREED WITH PLAN OF CARE: Patient  Elston JINNY Slot, OTR/L 12/12/2023, 10:48 AM

## 2023-12-19 ENCOUNTER — Ambulatory Visit: Admitting: Occupational Therapy

## 2023-12-19 DIAGNOSIS — M25641 Stiffness of right hand, not elsewhere classified: Secondary | ICD-10-CM

## 2023-12-19 DIAGNOSIS — M79642 Pain in left hand: Secondary | ICD-10-CM

## 2023-12-19 DIAGNOSIS — M65331 Trigger finger, right middle finger: Secondary | ICD-10-CM | POA: Diagnosis not present

## 2023-12-19 DIAGNOSIS — M65332 Trigger finger, left middle finger: Secondary | ICD-10-CM

## 2023-12-19 DIAGNOSIS — M79641 Pain in right hand: Secondary | ICD-10-CM

## 2023-12-19 NOTE — Therapy (Signed)
 OUTPATIENT OCCUPATIONAL THERAPY ORTHO TREATMENT/RECERT  Patient Name: Kathryn Owen MRN: [id number] DOB:1945-06-03, 78 y.o., female Today's Date: 12/19/2023  PCP: Dr Ross MART PROVIDER: Dr Kathlynn  END OF SESSION:  OT End of Session - 12/19/23 1047     Visit Number 20    Number of Visits 21    Date for Recertification  12/26/23    OT Start Time 1036    OT Stop Time 1115    OT Time Calculation (min) 39 min    Activity Tolerance Patient tolerated treatment well    Behavior During Therapy WFL for tasks assessed/performed             Past Medical History:  Diagnosis Date   Allergy history unknown    Arthritis    Asthma    Carpal tunnel syndrome    Fatty liver    GERD (gastroesophageal reflux disease)    Herpes zoster    HLD (hyperlipidemia)    mixed   HTN (hypertension)    Hypercalcemia    Hyperparathyroidism    secondary   Insomnia    Osteopenia    Rosacea    Vitamin B deficiency    Vitamin D deficiency    Past Surgical History:  Procedure Laterality Date   CARDIOVASCULAR STRESS TEST     nml    CESAREAN SECTION     x3   Patient Active Problem List   Diagnosis Date Noted   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   HYPERLIPIDEMIA-MIXED 01/10/2009   ESSENTIAL HYPERTENSION, BENIGN 01/10/2009    ONSET DATE: Dec 24  REFERRING DIAG: bilateral 3rd digits trigger fingers   THERAPY DIAG:  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  Stiffness of joints of both hands  Pain in right hand  Pain in left hand  Rationale for Evaluation and Treatment: Rehabilitation  SUBJECTIVE:   SUBJECTIVE STATEMENT: My R hand and wrist again bothering me - I had to drive a lot yesterday - the middle finger is staying more numb and tight - L hand is doing really good - not triggering even every day- I am seeing Dr Ezra end of month  Pt accompanied by: self  PERTINENT HISTORY: Assessment/Plan: 07/08/23 Dr Kathlynn note  Assessment & Plan Trigger finger  Bilateral  trigger finger affects both hands, causing locking of the middle fingers daily, especially in the morning, for over five months. She cannot tolerate steroid injections due to adverse reactions. Hand therapy and paraffin baths are considered as alternative treatments. Surgery remains an option if conservative measures fail, with minimal risk for complications. She will be referred to hand therapy at [facility] in [place] with therapist Deland. Paraffin bath therapy is considered to increase blood flow and reduce pain. Reassessment is planned in one month to evaluate the effectiveness of hand therapy. If symptoms persist, surgical intervention to release the A1 pulley will be discussed.  Dupuytren's contracture  A fibrous band under the skin suggests Dupuytren's contracture, in addition to trigger finger.  Diabetes mellitus without complications  Diabetes is well-controlled with an A1c of 5.5. Blood sugar levels are managed through diet without medication. Diagnoses and all orders for this visit:  Trigger middle finger of left hand - Ambulatory Referral to Occupational Therapy  Trigger middle finger of right hand - Ambulatory Referral to Occupational Therapy  Controlled type 2 diabetes mellitus with stage 3 chronic kidney disease, with long-term current use of insulin (CMS/HHS-HCC)   PRECAUTIONS: None    WEIGHT BEARING RESTRICTIONS: No  PAIN:  Are you having pain?  Soreness /tenderness over CT and 3rd digit R - 1-2/10  FALLS: Has patient fallen in last 6 months? No  LIVING ENVIRONMENT: Lives with: lives alone   PLOF: Prior to December no triggering.  Stiffness in the fingers.  Patient independent doing housework and cooking and laundry.  Takes care of her 57-year-old great grand daughter in the afternoons 1-5.  Drive  PATIENT GOALS: I want to get the trigger fingers better and the locking and the pain so I do not need surgery.  NEXT MD VISIT: 4 to 6  weeks  OBJECTIVE:  Note: Objective measures were completed at Evaluation unless otherwise noted.  HAND DOMINANCE: Right  ADLs: Patient limited with increased triggering as well as pain in gripping activities, turning a doorknob holding objects pulling pushing.  FUNCTIONAL OUTCOME MEASURES: Neck session  UPPER EXTREMITY ROM:     Active ROM Right eval Left eval  Shoulder flexion    Shoulder abduction    Shoulder adduction    Shoulder extension    Shoulder internal rotation    Shoulder external rotation    Elbow flexion    Elbow extension    Wrist flexion 62 72  Wrist extension    Wrist ulnar deviation    Wrist radial deviation    Wrist pronation    Wrist supination    (Blank rows = not tested)  Active ROM Right eval Left eval R/L 09/19/23 PROM R/L 11/04/23 AROM  R/L 11/21/23 R 11/28/23 R 12/05/23  Thumb MCP (0-60)         Thumb IP (0-80)         Thumb Radial abd/add (0-55)          Thumb Palmar abd/add (0-45)          Thumb Opposition to Small Finger          Index MCP (0-90) 85 80   80/70 70/70 75 75  Index PIP (0-100) 90  95  90/90 90/90  90  Index DIP (0-70)           Long MCP (0-90) 75  85  90/90 60/80 60/80  75 75  Long PIP (0-100) 90  ext -15  95 et -10  -10 ext coming in 95/L100 80/95 80/90  90  Long DIP (0-70)     60/70      Ring MCP (0-90)  85  90  60/80 75/85 75 80  Ring PIP (0-100)  90  95  75/100 90/100  90  Ring DIP (0-70)           Little MCP (0-90)  90 90   80/90 80/90 85 90  Little PIP (0-100)  85  95  70/90 90/90  90  Little DIP (0-70)           (Blank rows = not tested)   HAND FUNCTION: Grip strength: Right: 30 lbs; Left: 30 lbs, Lateral pinch: Right: 10 lbs, Left: 13 lbs, and 3 point pinch: Right: 9 lbs, Left: 6 lbs 09/19/23 Grip strength: Right: 30 lbs; Left: 38 lbs, Lateral pinch: Right: 11 lbs, Left: 13 lbs, and 3 point pinch: Right: 11 lbs, Left: 8 lbs 09/25/23 Grip strength: Right: 34 lbs; Left: 42 lbs, Lateral pinch: Right: 11 lbs,  Left: 13 lbs, and 3 point pinch: Right: 11 lbs, Left: 9 lbs 8/4//25 Grip strength: Right: 18 lbs; Left: 32 lbs, Lateral pinch: Right: 9 lbs, Left: 11 lbs, and 3 point pinch: Right: 9  lbs, Left: 8 lbs 11/04/23 Grip strength: Right: 12 lbs; Left: 25 lbs, Lateral pinch: Right: 8 lbs, Left: 10 lbs, and 3 point pinch: Right: 7 lbs, Left: 9 lbs 11/07/23 Grip strength: Right: 15 lbs; Left: 30 lbs, Lateral pinch: Right: 8 lbs, Left: 10 lbs, and 3 point pinch: Right: 7 lbs, Left: 9 lbs 12/05/23 Grip strength: Right: 19 lbs; Left: 30 lbs, Lateral pinch: Right: 10 lbs, Left: 12 lbs, and 3 point pinch: Right: 7 lbs, Left: 9 lbs COORDINATION: Limited because of triggering when attempting to bend or flex third digit SENSATION: Denies any sensory issues  EDEMA: A1 pulleys tender in the large  COGNITION: Overall cognitive status: Within functional limits for tasks assessed     TREATMENT DATE: 12/19/23      L hand pt arrive - report done her HEP already this am and able to make fist and use it normally -still wear CT splint - but 3rd digit not triggering every day  R hand worse today and wrist - had to drive a lot yesterday PROM better than AROM for fisting -especially third digit Increase edema in wrist - increase stiffness in wrist flexion and extention  Increase numbness in right 3rd digit  Done contrast 8 minutes on right hand to decrease  edema and decrease stiffness increase motion on the right prior to manual therapy  Manual Therapy: Following modalities, pt was seen for manual therapy techniques performed by therapist for soft tissue mobilization and massage Use of Graston tool #6 performed over palm and for brushing and sweeping over the volar digits to decrease pain, stiffness and promote increased ROM-focusing mostly on third digits  MC spreads and webspace in thumb in combination with PA and RA of thumb - AAROM  Done joint mobs to wrist and digits - prior to ROM  Patient with full PIP  extension on R digits Patient to continue with prayer stretch for composite wrist extension at home, cues to hold ~3 sec in wrist extension as tolerable   Therapeutic Exercises:   Wrist flexion and extention -AAROM and PROM done by OT 20 reps Patient to focus on left hand performing MCP flexion to 90  but remind pt to block wrist to prevent flexion at the wrist  followed by DIP/PIP flexion - blocked intrinsic fist  Followed by passive range of motion composite flexion 10 reps with focus on 3rd digit  Followed by place and hold active range of motion composite flexion 10 reps    PATIENT EDUCATION: Education details: findings of eval and HEP  Person educated: Patient Education method: Explanation, Demonstration, Tactile cues, Verbal cues, and Handouts Education comprehension: verbalized understanding, returned demonstration, verbal cues required, and needs further education    GOALS: Goals reviewed with patient? Yes  LONG TERM GOALS: Target date: 6 wks  Patient be independent home program to increase motion and decrease pain to less than 3/10. Baseline: Patient no knowledge of home program.  Pain and tenderness 8-9/10.  Triggering several times in the clinic and reported during the day. Goal status: Met  2.  Patient's passive range of motion to bilateral third digits improved to touching palm with no increased pain or triggering. Baseline: Third digit right hand MC 75 and PIP 90.  Left third MCP 85 and PIP 95.  But triggered sensation.  Tenderness and pain 8-9/10.  Patient also showing decreased PIP extension in bilateral thirds -10 to -15 degrees NOW patient pain decreased to 0-1/10.  Was able to touch palm with  passive range of motion the last week or 2.  But  increase inflammation in her body and pain with movement in multiple joints-digits your PCP and done blood work await results Goal status: Progressing  3.  Patient verbalize 2-3 modifications and adaptations to decrease  triggering as well as pain in bilateral hands. Baseline: Patient no knowledge.  Pain is 8-9/10 over bilateral third digits as well as A1 pulley.  Triggering several times during the day as well as in session.   Goal status: Met  4.  Patient's show composite flexion touching palm to use in ADLs and IADLs with less than 2 episodes of triggering a day Baseline: Patient with several times during the day or every time attempting to do composite flexion triggering at bilateral third digits.  Pain 8-9/10 pain.  Decreased range of motion in bilateral digits Goal status: Progressing  5.  Grip and prevention strength improved to within normal limits for her age with no increased pain and symptoms to return to prior level of function Baseline: Grip 30 pounds left and right hand lateral pinch on the right 10 pounds left 13 pounds and 3 point pinch 9 and left 6 pounds Goal status: progressing    ASSESSMENT:  CLINICAL IMPRESSION: Patient was referred to occupational therapy for bilateral third digit trigger fingers.  Patient reports started in December 24.  Patient not able to get any shots because of allergic reactions and diabetic.  Patient pain at eval  8-9/10 over bilateral third digits as well as A1 pulleys.  Patient showing in session several episodes of triggering.  Also report every attempt of composite fist third digits triggering or locking.  With worse in the morning.  Patient with decreased flexion in all digits as well as decreased PIP extension in bilateral third digits with -10 to -15 degrees.  Grip and prehension strength decreased in bilateral hands NOW . Patient to continue with the bilateral wrist splints for carpal tunnel symptoms, issued Med size for L hand due to rubbing along thumb last visit - Pt show improvement in L hand - doing still HEP in the am -and ROM - but triggering no every day - R hand third digit A1 pulley tenderness improved.  But continues to be stiff.  Patient continues to  be tender over right CT more than left.  Patient report increased numbness in the right third digit.  Patient reports she is seeing Dr. Ezra the end of the month.  Patient to continue with contrast followed by tendon glides.  Had to reinforce with patient again [facility] flexion but have to blocked the wrist to not irritate carpal tunnel patient will be seen 1 more session prior to seeing Dr. Ezra..  Patient can benefit from skilled OT services to decrease pain and inflammation and increase motion and strength as well as joint protection and modification education, splinting and modalities to return to prior level of function.  PERFORMANCE DEFICITS: in functional skills including ADLs, IADLs, ROM, strength, pain, flexibility, decreased knowledge of use of DME, and UE functional use,   and psychosocial skills including environmental adaptation and routines and behaviors.   IMPAIRMENTS: are limiting patient from ADLs, IADLs, rest and sleep, play, leisure, and social participation.   COMORBIDITIES: has no other co-morbidities that affects occupational performance. Patient will benefit from skilled OT to address above impairments and improve overall function.  MODIFICATION OR ASSISTANCE TO COMPLETE EVALUATION: No modification of tasks or assist necessary to complete an evaluation.  OT OCCUPATIONAL PROFILE  AND HISTORY: Problem focused assessment: Including review of records relating to presenting problem.  CLINICAL DECISION MAKING: LOW - limited treatment options, no task modification necessary  REHAB POTENTIAL: Good for goals  EVALUATION COMPLEXITY: Low   PLAN:  OT FREQUENCY: 1 time a week  OT DURATION: 2 wks  PLANNED INTERVENTIONS: 97168 OT Re-evaluation, 97535 self care/ADL training, 02889 therapeutic exercise, 97530 therapeutic activity, 97140 manual therapy, 97035 ultrasound, 97018 paraffin, 02960 fluidotherapy, 97034 contrast bath, 97033 iontophoresis, orthotics, joint protection and  modifications   CONSULTED AND AGREED WITH PLAN OF CARE: Patient  Ancel Peters, OTR/L 12/19/2023, 1:00 PM

## 2023-12-26 ENCOUNTER — Encounter: Payer: Self-pay | Admitting: Occupational Therapy

## 2023-12-26 ENCOUNTER — Ambulatory Visit: Admitting: Occupational Therapy

## 2023-12-26 DIAGNOSIS — M25641 Stiffness of right hand, not elsewhere classified: Secondary | ICD-10-CM

## 2023-12-26 DIAGNOSIS — M65331 Trigger finger, right middle finger: Secondary | ICD-10-CM

## 2023-12-26 NOTE — Therapy (Signed)
 OUTPATIENT OCCUPATIONAL THERAPY ORTHO TREATMENT  Patient Name: Kathryn Owen MRN: [id number] DOB:10/03/1945, 78 y.o., female Today's Date: 12/26/2023  PCP: Dr Ross MART PROVIDER: Dr Kathlynn  END OF SESSION:  OT End of Session - 12/26/23 1043     Visit Number 21    Number of Visits 21    Date for Recertification  12/26/23    OT Start Time 1040    OT Stop Time 1118    OT Time Calculation (min) 38 min    Activity Tolerance Patient tolerated treatment well    Behavior During Therapy WFL for tasks assessed/performed              Past Medical History:  Diagnosis Date   Allergy history unknown    Arthritis    Asthma    Carpal tunnel syndrome    Fatty liver    GERD (gastroesophageal reflux disease)    Herpes zoster    HLD (hyperlipidemia)    mixed   HTN (hypertension)    Hypercalcemia    Hyperparathyroidism    secondary   Insomnia    Osteopenia    Rosacea    Vitamin B deficiency    Vitamin D deficiency    Past Surgical History:  Procedure Laterality Date   CARDIOVASCULAR STRESS TEST     nml    CESAREAN SECTION     x3   Patient Active Problem List   Diagnosis Date Noted   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   Pt accompanied by: self  PERTINENT HISTORY: Assessment/Plan: 07/08/23 Dr Kathlynn note  Assessment & Plan Trigger finger  Bilateral trigger finger affects both hands, causing locking of the middle fingers daily, especially in the morning, for over five months. She cannot tolerate steroid  injections due to adverse reactions. Hand therapy and paraffin baths are considered as alternative treatments. Surgery remains an option if conservative measures fail, with minimal risk for complications. She will be referred to hand therapy at [facility] in [place] with therapist Deland. Paraffin bath therapy is considered to increase blood flow and reduce pain. Reassessment is planned in one month to evaluate the effectiveness of hand therapy. If symptoms persist, surgical intervention to release the A1 pulley will be discussed.  Dupuytren's contracture  A fibrous band under the skin suggests Dupuytren's contracture, in addition to trigger finger.  Diabetes mellitus without complications  Diabetes is well-controlled with an A1c of 5.5. Blood sugar levels are managed through diet without medication. Diagnoses and all orders for this visit:  Trigger middle finger of left hand - Ambulatory Referral to Occupational Therapy  Trigger middle finger of right hand - Ambulatory Referral to Occupational Therapy  Controlled type 2 diabetes mellitus with stage 3 chronic kidney disease, with long-term current use of insulin (CMS/HHS-HCC)   PRECAUTIONS: None    WEIGHT BEARING RESTRICTIONS: No  PAIN:  Are you having pain?  Soreness /tenderness over CT and 3rd digit R - 1/10  FALLS: Has patient fallen in last 6 months? No  LIVING ENVIRONMENT: Lives with:  lives alone   PLOF: Prior to December no triggering.  Stiffness in the fingers.  Patient independent doing housework and cooking and laundry.  Takes care of her 79-year-old great grand daughter in the afternoons 1-5.  Drive  PATIENT GOALS: I want to get the trigger fingers better and the locking and the pain so I do not need surgery.  NEXT MD VISIT: Nov 3  OBJECTIVE:  Note: Objective measures were completed at Evaluation unless otherwise noted.  HAND DOMINANCE: Right  ADLs: Patient limited with increased triggering  as well as pain in gripping activities, turning a doorknob holding objects pulling pushing.  FUNCTIONAL OUTCOME MEASURES: Neck session  UPPER EXTREMITY ROM:     Active ROM Right eval R 12/26/23 Left eval L 12/26/23  Shoulder flexion      Shoulder abduction      Shoulder adduction      Shoulder extension      Shoulder internal rotation      Shoulder external rotation      Elbow flexion      Elbow extension      Wrist flexion 62 62 72 70  Wrist extension      Wrist ulnar deviation      Wrist radial deviation      Wrist pronation      Wrist supination      (Blank rows = not tested)  Active ROM Right eval Left eval R/L 09/19/23 PROM R/L 11/04/23 AROM  R/L 11/21/23 R 11/28/23 R 12/05/23  Thumb MCP (0-60)         Thumb IP (0-80)         Thumb Radial abd/add (0-55)          Thumb Palmar abd/add (0-45)          Thumb Opposition to Small Finger          Index MCP (0-90) 85 80   80/70 70/70 75 75  Index PIP (0-100) 90  95  90/90 90/90  90  Index DIP (0-70)           Long MCP (0-90) 75  85  90/90 60/80 60/80  75 75  Long PIP (0-100) 90  ext -15  95 et -10  -10 ext coming in 95/L100 80/95 80/90  90  Long DIP (0-70)     60/70      Ring MCP (0-90)  85  90  60/80 75/85 75 80  Ring PIP (0-100)  90  95  75/100 90/100  90  Ring DIP (0-70)           Little MCP (0-90)  90 90   80/90 80/90 85 90  Little PIP (0-100)  85  95  70/90 90/90  90  Little DIP (0-70)           (Blank rows = not tested)   HAND FUNCTION: Grip strength: Right: 30 lbs; Left: 30 lbs, Lateral pinch: Right: 10 lbs, Left: 13 lbs, and 3 point pinch: Right: 9 lbs, Left: 6 lbs 09/19/23 Grip strength: Right: 30 lbs; Left: 38 lbs, Lateral pinch: Right: 11 lbs, Left: 13 lbs, and 3 point pinch: Right: 11 lbs, Left: 8 lbs 09/25/23 Grip strength: Right: 34 lbs; Left: 42 lbs, Lateral pinch: Right: 11 lbs, Left: 13 lbs, and 3 point pinch: Right: 11 lbs, Left: 9 lbs 8/4//25 Grip strength: Right: 18 lbs; Left: 32 lbs, Lateral  pinch: Right: 9 lbs, Left: 11 lbs, and 3 point pinch: Right: 9 lbs, Left:  8 lbs 11/04/23 Grip strength: Right: 12 lbs; Left: 25 lbs, Lateral pinch: Right: 8 lbs, Left: 10 lbs, and 3 point pinch: Right: 7 lbs, Left: 9 lbs 11/07/23 Grip strength: Right: 15 lbs; Left: 30 lbs, Lateral pinch: Right: 8 lbs, Left: 10 lbs, and 3 point pinch: Right: 7 lbs, Left: 9 lbs 12/05/23 Grip strength: Right: 19 lbs; Left: 30 lbs, Lateral pinch: Right: 10 lbs, Left: 12 lbs, and 3 point pinch: Right: 7 lbs, Left: 9 lbs 12/26/23 Grip strength: Right: 24 lbs; Left: 34 lbs, Lateral pinch: Right: 9 lbs, Left: 10 lbs, and 3 point pinch: Right: 6 lbs, Left: 7 lbs  COORDINATION: Limited because of triggering when attempting to bend or flex third digit SENSATION: Denies any sensory issues  EDEMA: A1 pulleys tender in the large  COGNITION: Overall cognitive status: Within functional limits for tasks assessed     TREATMENT DATE: 12/26/23      Pt reports wearing her brace with some activities, but stopped wearing overnight due to increased pain at B wrists Reports triggering in L hand during exercises only, none during the day this week with activity. Reports triggering in R hand 2x only with exercises.  Improving numbness in right 3rd digit, none in L 3rd digit.  Endorses pain/tenderness at carpal tunnel, increased pain after wearing braces at nighttime this week  Tolerated contrast bath 8 minutes on B hands to decrease edema and decrease stiffness to increase motion on the prior to manual therapy  Manual Therapy: Following modalities, pt was seen for manual therapy techniques performed by therapist for soft tissue mobilization and massage MC spreads and webspace in thumb in combination with PA and RA of thumb - AAROM  Done joint mobs to wrist and digits - prior to ROM  Patient with full PIP extension on R digits Patient to continue with prayer stretch for composite wrist extension at home, cues to hold ~3 sec in  wrist extension as tolerable   Therapeutic Exercises:  Wrist flexion and extention -AAROM and PROM done by OT 20 reps Patient to focus on left hand performing MCP flexion to 90  but remind pt to block wrist to prevent flexion at the wrist  followed by DIP/PIP flexion - blocked intrinsic fist  Followed by passive range of motion composite flexion 10 reps with focus on 3rd digit  Followed by place and hold active range of motion composite flexion 10 reps Reviewed proper technique to avoid triggering (stop PROM before touching palm to avoid causing triggering).  Reviewed completing HEP only 2x/day for ROM exercises as pt reports constantly doing PROM, suspect causing irritation.    PATIENT EDUCATION: Education details: findings of eval and HEP  Person educated: Patient Education method: Explanation, Demonstration, Tactile cues, Verbal cues, and Handouts Education comprehension: verbalized understanding, returned demonstration, verbal cues required, and needs further education    GOALS: Goals reviewed with patient? Yes  LONG TERM GOALS: Target date: 6 wks  Patient be independent home program to increase motion and decrease pain to less than 3/10. Baseline: Patient no knowledge of home program.  Pain and tenderness 8-9/10.  Triggering several times in the clinic and reported during the day. Goal status: Met  2.  Patient's passive range of motion to bilateral third digits improved to touching palm with no increased pain or triggering. Baseline: Third digit right hand MC 75 and PIP 90.  Left third MCP 85 and PIP 95.  But triggered sensation.  Tenderness and pain 8-9/10.  Patient also  showing decreased PIP extension in bilateral thirds -10 to -15 degrees NOW patient pain decreased to 0-1/10.  Was able to touch palm with passive range of motion the last week or 2.  But  increase inflammation in her body and pain with movement in multiple joints-digits your PCP and done blood work await  results Goal status: Progressing  3.  Patient verbalize 2-3 modifications and adaptations to decrease triggering as well as pain in bilateral hands. Baseline: Patient no knowledge.  Pain is 8-9/10 over bilateral third digits as well as A1 pulley.  Triggering several times during the day as well as in session.   Goal status: Met  4.  Patient's show composite flexion touching palm to use in ADLs and IADLs with less than 2 episodes of triggering a day Baseline: Patient with several times during the day or every time attempting to do composite flexion triggering at bilateral third digits.  Pain 8-9/10 pain.  Decreased range of motion in bilateral digits Goal status: Progressing  5.  Grip and prevention strength improved to within normal limits for her age with no increased pain and symptoms to return to prior level of function Baseline: Grip 30 pounds left and right hand lateral pinch on the right 10 pounds left 13 pounds and 3 point pinch 9 and left 6 pounds Goal status: progressing    ASSESSMENT:  CLINICAL IMPRESSION: Patient was referred to occupational therapy for bilateral third digit trigger fingers.  Patient reports started in December 24.  Patient not able to get any shots because of allergic reactions and diabetic.  Patient pain at eval  8-9/10 over bilateral third digits as well as A1 pulleys.  Patient showing in session several episodes of triggering.  Also report every attempt of composite fist third digits triggering or locking.  With worse in the morning.  Patient with decreased flexion in all digits as well as decreased PIP extension in bilateral third digits with -10 to -15 degrees.  Grip and prehension strength decreased in bilateral hands  NOW: Patient reports wearing B wrist splints with heavy activity only, educated on returning to nighttime splint wear if numbness increases. Improving B 3rd digit numbness noted, mild pain/tenderness over R carpal tunnel. Pt reports L 3rd digit  triggering during exercises - reports completing multiple times t/o day when sitting, educated on completing 2x only to limit overuse and irritation. Grip strength increased R 24#, L 34#. Provided videos for HEP as pt continues to require cues for proper technique. Pt plan to see orthopedics for further orders prior to continuing therapy and has referral to rheumatologist. Patient can benefit from skilled OT services to decrease pain and inflammation and increase motion and strength as well as joint protection and modification education, splinting and modalities to return to prior level of function.  PERFORMANCE DEFICITS: in functional skills including ADLs, IADLs, ROM, strength, pain, flexibility, decreased knowledge of use of DME, and UE functional use,   and psychosocial skills including environmental adaptation and routines and behaviors.   IMPAIRMENTS: are limiting patient from ADLs, IADLs, rest and sleep, play, leisure, and social participation.   COMORBIDITIES: has no other co-morbidities that affects occupational performance. Patient will benefit from skilled OT to address above impairments and improve overall function.  MODIFICATION OR ASSISTANCE TO COMPLETE EVALUATION: No modification of tasks or assist necessary to complete an evaluation.  OT OCCUPATIONAL PROFILE AND HISTORY: Problem focused assessment: Including review of records relating to presenting problem.  CLINICAL DECISION MAKING: LOW - limited  treatment options, no task modification necessary  REHAB POTENTIAL: Good for goals  EVALUATION COMPLEXITY: Low   PLAN:  OT FREQUENCY: 1 time a week  OT DURATION: 2 wks  PLANNED INTERVENTIONS: 97168 OT Re-evaluation, 97535 self care/ADL training, 02889 therapeutic exercise, 97530 therapeutic activity, 97140 manual therapy, 97035 ultrasound, 97018 paraffin, 02960 fluidotherapy, 97034 contrast bath, 97033 iontophoresis, orthotics, joint protection and modifications   CONSULTED AND  AGREED WITH PLAN OF CARE: Patient  Elston JINNY Slot, OTR/L 12/26/2023, 11:23 AM

## 2024-01-19 ENCOUNTER — Ambulatory Visit: Admitting: Occupational Therapy

## 2024-01-19 DIAGNOSIS — M25642 Stiffness of left hand, not elsewhere classified: Secondary | ICD-10-CM | POA: Insufficient documentation

## 2024-01-19 DIAGNOSIS — M65332 Trigger finger, left middle finger: Secondary | ICD-10-CM | POA: Insufficient documentation

## 2024-01-19 DIAGNOSIS — M65331 Trigger finger, right middle finger: Secondary | ICD-10-CM | POA: Diagnosis present

## 2024-01-19 DIAGNOSIS — M79642 Pain in left hand: Secondary | ICD-10-CM | POA: Insufficient documentation

## 2024-01-19 DIAGNOSIS — M25641 Stiffness of right hand, not elsewhere classified: Secondary | ICD-10-CM | POA: Insufficient documentation

## 2024-01-19 DIAGNOSIS — M79641 Pain in right hand: Secondary | ICD-10-CM | POA: Diagnosis present

## 2024-01-19 NOTE — Therapy (Signed)
 OUTPATIENT OCCUPATIONAL THERAPY ORTHO TREATMENT/RECERT  Patient Name: Kathryn Owen MRN: [id number] DOB:08-07-1945, 78 y.o., female Today's Date: 01/19/2024  PCP: Dr Ross MART PROVIDER: Dr Ezra  END OF SESSION:  OT End of Session - 01/19/24 1121     Visit Number 22    Number of Visits 26    Date for Recertification  05/24/24    OT Start Time 1121    OT Stop Time 1152    OT Time Calculation (min) 31 min    Activity Tolerance Patient tolerated treatment well    Behavior During Therapy WFL for tasks assessed/performed              Past Medical History:  Diagnosis Date   Allergy history unknown    Arthritis    Asthma    Carpal tunnel syndrome    Fatty liver    GERD (gastroesophageal reflux disease)    Herpes zoster    HLD (hyperlipidemia)    mixed   HTN (hypertension)    Hypercalcemia    Hyperparathyroidism    secondary   Insomnia    Osteopenia    Rosacea    Vitamin B deficiency    Vitamin D deficiency    Past Surgical History:  Procedure Laterality Date   CARDIOVASCULAR STRESS TEST     nml    CESAREAN SECTION     x3   Patient Active Problem List   Diagnosis Date Noted   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   HYPERLIPIDEMIA-MIXED 01/10/2009   ESSENTIAL HYPERTENSION, BENIGN 01/10/2009    ONSET DATE: Dec 24  REFERRING DIAG: bilateral 3rd digits trigger fingers /Bilateral CTS  THERAPY DIAG:  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  Stiffness of joints of both hands  Pain in right hand  Pain in left hand  Rationale for Evaluation and Treatment: Rehabilitation  SUBJECTIVE:   SUBJECTIVE STATEMENT: Feeling soreness in the am at the wrist  and sleeping still with braces - but not wearing during day - R thumb still little numbness but better- Triggering much better- locking once a while on the L - R one better- not been locking in a while   Pt accompanied by: self  PERTINENT HISTORY: Assessment/Plan: 07/08/23 Dr Kathlynn  note  Assessment & Plan Trigger finger  Bilateral trigger finger affects both hands, causing locking of the middle fingers daily, especially in the morning, for over five months. She cannot tolerate steroid injections due to adverse reactions. Hand therapy and paraffin baths are considered as alternative treatments. Surgery remains an option if conservative measures fail, with minimal risk for complications. She will be referred to hand therapy at [facility] in [place] with therapist Deland. Paraffin bath therapy is considered to increase blood flow and reduce pain. Reassessment is planned in one month to evaluate the effectiveness of hand therapy. If symptoms persist, surgical intervention to release the A1 pulley will be discussed.  Dupuytren's contracture  A fibrous band under the skin suggests Dupuytren's contracture, in addition to trigger finger.  Diabetes mellitus without complications  Diabetes is well-controlled with an A1c of 5.5. Blood sugar levels are managed through diet without medication. Diagnoses and all orders for this visit:  Trigger middle finger of left hand - Ambulatory Referral to Occupational Therapy  Trigger middle finger of right hand - Ambulatory Referral to Occupational Therapy  Controlled type 2 diabetes mellitus with stage 3 chronic kidney disease, with long-term current use of insulin (CMS/HHS-HCC)    01/12/24 Dr Ezra note:  Right Upper Extremity: Pain to palpation about the middle finger A1 pulley or ring finger A1 pulley. Negative Tinel's, Durkan's, Phalen's at the wrist. 5 out of 5 thumb palmar abduction strength. No active triggering noted with flexion extension of the digits. Able to make a full composite fist. Full extension of all the digits. Sensation intact to light touch throughout. Digits are warm and well-perfused.  Left Upper Extremity: Pain to palpation about the middle finger A1 pulley. Negative Tinel's, Durkan's,  Phalen's at the wrist. 5 out of 5 thumb palmar abduction strength. No active triggering noted with flexion extension of the digits. Able to make a full composite fist. Full extension of all the digits. Sensation intact to light touch throughout. Digits are warm and well-perfused.  Imaging and Results: NA  Assessment & Plan:  Right middle and ring finger trigger fingers - Significant improvement with therapy - Continue with therapy due to excellent results - New therapy referral placed  Left middle finger trigger finger - Significant improvement with therapy - Continue with therapy due to excellent results - New therapy referral placed  Bilateral carpal tunnel syndrome - Mild symptoms that are responding well to nocturnal wrist brace usage - Continue the braces as long as they are alleviating symptoms      PRECAUTIONS: None    WEIGHT BEARING RESTRICTIONS: No  PAIN:  Are you having pain?  Tenderness 1/10 L 3rd A1pulley   FALLS: Has patient fallen in last 6 months? No  LIVING ENVIRONMENT: Lives with: lives alone   PLOF: Prior to December no triggering.  Stiffness in the fingers.  Patient independent doing housework and cooking and laundry.  Takes care of her 27-year-old great grand daughter in the afternoons 1-5.  Drive  PATIENT GOALS: I want to get the trigger fingers better and the locking and the pain so I do not need surgery.  NEXT MD VISIT: Nov 3  OBJECTIVE:  Note: Objective measures were completed at Evaluation unless otherwise noted.  HAND DOMINANCE: Right  ADLs: Patient limited with increased triggering as well as pain in gripping activities, turning a doorknob holding objects pulling pushing.  FUNCTIONAL OUTCOME MEASURES: Neck session  UPPER EXTREMITY ROM:     Active ROM Right eval R 12/26/23 Left eval L 12/26/23 R/L 01/19/24  Shoulder flexion       Shoulder abduction       Shoulder adduction       Shoulder extension       Shoulder internal  rotation       Shoulder external rotation       Elbow flexion       Elbow extension     65/65  Wrist flexion 62 62 72 70 65/70  Wrist extension       Wrist ulnar deviation       Wrist radial deviation       Wrist pronation       Wrist supination       (Blank rows = not tested)  Active ROM Right eval Left eval R/L 09/19/23 PROM R/L 11/04/23 AROM  R/L 11/21/23 R 11/28/23 R 12/05/23 R/L 01/19/24  Thumb MCP (0-60)          Thumb IP (0-80)          Thumb Radial abd/add (0-55)           Thumb Palmar abd/add (0-45)           Thumb Opposition to Small Finger  Index MCP (0-90) 85 80   80/70 70/70 75 75 80/80  Index PIP (0-100) 90  95  90/90 90/90  90 100/100  Index DIP (0-70)            Long MCP (0-90) 75  85  90/90 60/80 60/80  75 75 80/80  Long PIP (0-100) 90  ext -15  95 et -10  -10 ext coming in 95/L100 80/95 80/90  90 100/100  Long DIP (0-70)     60/70       Ring MCP (0-90)  85  90  60/80 75/85 75 80 80/90  Ring PIP (0-100)  90  95  75/100 90/100  90 100/100  Ring DIP (0-70)            Little MCP (0-90)  90 90   80/90 80/90 85 90 90/90  Little PIP (0-100)  85  95  70/90 90/90  90 100/100  Little DIP (0-70)            (Blank rows = not tested)   HAND FUNCTION: Grip strength: Right: 30 lbs; Left: 30 lbs, Lateral pinch: Right: 10 lbs, Left: 13 lbs, and 3 point pinch: Right: 9 lbs, Left: 6 lbs 09/19/23 Grip strength: Right: 30 lbs; Left: 38 lbs, Lateral pinch: Right: 11 lbs, Left: 13 lbs, and 3 point pinch: Right: 11 lbs, Left: 8 lbs 09/25/23 Grip strength: Right: 34 lbs; Left: 42 lbs, Lateral pinch: Right: 11 lbs, Left: 13 lbs, and 3 point pinch: Right: 11 lbs, Left: 9 lbs 8/4//25 Grip strength: Right: 18 lbs; Left: 32 lbs, Lateral pinch: Right: 9 lbs, Left: 11 lbs, and 3 point pinch: Right: 9 lbs, Left: 8 lbs 11/04/23 Grip strength: Right: 12 lbs; Left: 25 lbs, Lateral pinch: Right: 8 lbs, Left: 10 lbs, and 3 point pinch: Right: 7 lbs, Left: 9 lbs 11/07/23 Grip strength: Right:  15 lbs; Left: 30 lbs, Lateral pinch: Right: 8 lbs, Left: 10 lbs, and 3 point pinch: Right: 7 lbs, Left: 9 lbs 12/05/23 Grip strength: Right: 19 lbs; Left: 30 lbs, Lateral pinch: Right: 10 lbs, Left: 12 lbs, and 3 point pinch: Right: 7 lbs, Left: 9 lbs 12/26/23 Grip strength: Right: 24 lbs; Left: 34 lbs, Lateral pinch: Right: 9 lbs, Left: 10 lbs, and 3 point pinch: Right: 6 lbs, Left: 7 lbs 01/19/24 Grip strength: Right: 30 lbs; Left: 36 lbs, Lateral pinch: Right: 9 lbs, Left: 10 lbs, and 3 point pinch: Right: 8 lbs, Left: 8 lbs COORDINATION: Limited because of triggering when attempting to bend or flex third digit SENSATION: Denies any sensory issues  EDEMA: A1 pulleys tender in the large  COGNITION: Overall cognitive status: Within functional limits for tasks assessed     TREATMENT DATE: 01/19/24      Patient return to OT after not being seen for about 3 to 4 weeks.   Patient seen Dr. Ezra about a week ago for checkup and follow-up.   Referred back to continue OT as needed.    Patient present with reports of improvement in her carpal tunnel symptoms as well as bilateral trigger fingers in the third digits.   See subjective above  Active range of motion in the wrist within functional limits.  Continues to be limited in flexion with some discomfort over the carpal tunnel  With reports of some mild tenderness in the morning right worse than the left.   Strength in the wrist and forearm in all planes 5-/5 and 5+/5 symptom-free  Patient reports  her wrist feels stronger.  And only wearing it at nighttime. Recommend for patient to use it during the day as needed with heavy activities or when noticing compensating with wrist flexion during gripping or tight grip.   Patient to stay with no tenderness over bilateral carpal tunnel as well as negative Tinel Tenderness over left third digit A1 pulley 1/10 Patient did have some triggering in the session.  But reports she had been helping the  family moving furniture yesterday. AROM within functional limits Active range of motion within functional limits on the right no tenderness or triggering  Grip and prehension strength improved greatly.  See flowsheet  Home program patient to continue with same home program she has been doing for the last 3 to 4 weeks. - Contrast in the morning for stiffness and increased motion - Blocked tendon glides-focus on MC flexion, blocked intrinsic a fist -Gentle composite wrist extension/pressure stretch Continue to sleep with carpal tunnel braces at night Wear them as needed during the day Trigger finger MC block splints to wear as needed if noticed triggering the day after overusing her hands Reviewed with her modification joint protection again.  PATIENT EDUCATION: Education details: findings of eval and HEP  Person educated: Patient Education method: Explanation, Demonstration, Tactile cues, Verbal cues, and Handouts Education comprehension: verbalized understanding, returned demonstration, verbal cues required, and needs further education    GOALS: Goals reviewed with patient? Yes  LONG TERM GOALS: Target date: 6 wks  Patient be independent home program to increase motion and decrease pain to less than 3/10. Baseline: Patient no knowledge of home program.  Pain and tenderness 8-9/10.  Triggering several times in the clinic and reported during the day. Goal status: Met  2.  Patient's passive range of motion to bilateral third digits improved to touching palm with no increased pain or triggering. Baseline: No tenderness and triggering in the right third digit; left patient report sporadically triggering.  Patient 1/10 tenderness over the left third A1 pulley with some triggering with composite fist today.  Patient reports she helped the family move some furniture yesterday.   Goal status: Progressing  3.  Patient verbalize 2-3 modifications and adaptations to decrease triggering as well  as pain in bilateral hands. Baseline: Patient no knowledge.  Pain is 8-9/10 over bilateral third digits as well as A1 pulley.  Triggering several times during the day as well as in session.   Goal status: Met  4.  Patient's show composite flexion touching palm to use in ADLs and IADLs with less than 2 episodes of triggering a day Baseline: Patient reports right hands third digit has not been locking for a while.  Left sporadically.  Able to perform her ADLs with much more ease and less triggering and locking or discomfort and pain  goal status: Met  5.  Grip and prevention strength improved to within normal limits for her age with no increased pain and symptoms to return to prior level of function Baseline:Grip strength: Right: 30 lbs; Left: 36 lbs, Lateral pinch: Right: 9 lbs, Left: 10 lbs, and 3 point pinch: Right: 8 lbs, Left: 8 lbs Goal status: Met    ASSESSMENT:  CLINICAL IMPRESSION: Patient was referred to occupational therapy for bilateral third digit trigger fingers.  Patient reports started in December 24.  Patient not able to get any shots because of allergic reactions and diabetic.  Patient pain at eval  8-9/10 over bilateral third digits as well as A1 pulleys.  Patient showing  in session several episodes of triggering.  Also report every attempt of composite fist third digits triggering or locking.  With worse in the morning.  Patient with decreased flexion in all digits as well as decreased PIP extension in bilateral third digits with -10 to -15 degrees.  Grip and prehension strength decreased in bilateral hands  NOW: Patient made great progress from start of care.  Bilateral wrist range of motion and strength within functional limits symptom-free.  As well as digit flexion extension with reports of no triggering on the right and left sporadically.  Patient did help family move furniture yesterday and shows some triggering on the left third digit but tenderness over the A1 pulley 1/10.   Patient showed fantastic progress in grip and prehension strength from start of care.  Within normal range for age.  Patient verbalized and demonstrated the last 3 weeks progress with a home program.  Patient referred to continue OT as needed by orthopedics.  Will decrease patient to 3 visits over the 4 months to reassess and update home program as needed through the winter months if patient has increased symptoms.  Patient can benefit from skilled OT services to decrease pain and inflammation and increase motion and strength as well as joint protection and modification education, splinting and modalities to return to prior level of function.  PERFORMANCE DEFICITS: in functional skills including ADLs, IADLs, ROM, strength, pain, flexibility, decreased knowledge of use of DME, and UE functional use,   and psychosocial skills including environmental adaptation and routines and behaviors.   IMPAIRMENTS: are limiting patient from ADLs, IADLs, rest and sleep, play, leisure, and social participation.   COMORBIDITIES: has no other co-morbidities that affects occupational performance. Patient will benefit from skilled OT to address above impairments and improve overall function.  MODIFICATION OR ASSISTANCE TO COMPLETE EVALUATION: No modification of tasks or assist necessary to complete an evaluation.  OT OCCUPATIONAL PROFILE AND HISTORY: Problem focused assessment: Including review of records relating to presenting problem.  CLINICAL DECISION MAKING: LOW - limited treatment options, no task modification necessary  REHAB POTENTIAL: Good for goals  EVALUATION COMPLEXITY: Low   PLAN:  OT FREQUENCY: 3-4 visits  OT DURATION: 4 months  PLANNED INTERVENTIONS: 97168 OT Re-evaluation, 97535 self care/ADL training, 02889 therapeutic exercise, 97530 therapeutic activity, 97140 manual therapy, 97035 ultrasound, 97018 paraffin, 02960 fluidotherapy, 97034 contrast bath, 97033 iontophoresis, orthotics, joint  protection and modifications   CONSULTED AND AGREED WITH PLAN OF CARE: Patient  Ancel Peters, OTR/L 01/19/2024, 11:53 AM

## 2024-03-16 ENCOUNTER — Ambulatory Visit: Admitting: Occupational Therapy

## 2024-03-26 ENCOUNTER — Ambulatory Visit: Admitting: Occupational Therapy

## 2024-04-06 ENCOUNTER — Ambulatory Visit: Admitting: Occupational Therapy

## 2024-04-19 ENCOUNTER — Ambulatory Visit: Admitting: Occupational Therapy
